# Patient Record
Sex: Female | Born: 1950
Health system: Southern US, Community
[De-identification: ages and names within clinical notes are randomized; demographics above are authoritative.]

## PROBLEM LIST (undated history)

## (undated) DIAGNOSIS — M1990 Unspecified osteoarthritis, unspecified site: Secondary | ICD-10-CM

## (undated) DIAGNOSIS — M6530 Trigger finger, unspecified finger: Secondary | ICD-10-CM

## (undated) HISTORY — DX: Trigger finger, unspecified finger: M65.30

## (undated) HISTORY — PX: TUBAL LIGATION: SHX77

## (undated) HISTORY — DX: Unspecified osteoarthritis, unspecified site: M19.90

---

## 1997-11-19 ENCOUNTER — Emergency Department (HOSPITAL_COMMUNITY): Admission: EM | Admit: 1997-11-19 | Discharge: 1997-11-19 | Payer: Self-pay | Admitting: Emergency Medicine

## 1998-07-22 ENCOUNTER — Ambulatory Visit (HOSPITAL_COMMUNITY): Admission: RE | Admit: 1998-07-22 | Discharge: 1998-07-22 | Payer: Self-pay | Admitting: Gastroenterology

## 1999-02-09 ENCOUNTER — Other Ambulatory Visit: Admission: RE | Admit: 1999-02-09 | Discharge: 1999-02-09 | Payer: Self-pay | Admitting: *Deleted

## 1999-09-03 ENCOUNTER — Other Ambulatory Visit: Admission: RE | Admit: 1999-09-03 | Discharge: 1999-09-03 | Payer: Self-pay | Admitting: *Deleted

## 2003-07-02 ENCOUNTER — Other Ambulatory Visit: Admission: RE | Admit: 2003-07-02 | Discharge: 2003-07-02 | Payer: Self-pay | Admitting: *Deleted

## 2003-07-07 ENCOUNTER — Encounter: Admission: RE | Admit: 2003-07-07 | Discharge: 2003-07-07 | Payer: Self-pay | Admitting: *Deleted

## 2015-08-31 ENCOUNTER — Encounter: Payer: Self-pay | Admitting: *Deleted

## 2016-03-22 ENCOUNTER — Encounter: Payer: Self-pay | Admitting: Physician Assistant

## 2016-03-22 ENCOUNTER — Ambulatory Visit (INDEPENDENT_AMBULATORY_CARE_PROVIDER_SITE_OTHER): Payer: BLUE CROSS/BLUE SHIELD | Admitting: Physician Assistant

## 2016-03-22 VITALS — BP 118/65 | HR 63 | Temp 98.1°F | Ht 65.25 in | Wt 128.8 lb

## 2016-03-22 DIAGNOSIS — Z01419 Encounter for gynecological examination (general) (routine) without abnormal findings: Secondary | ICD-10-CM

## 2016-03-22 NOTE — Patient Instructions (Signed)

## 2016-03-24 NOTE — Progress Notes (Signed)
BP 118/65   Pulse 63   Temp 98.1 F (36.7 C) (Oral)   Ht 5' 5.25" (1.657 m)   Wt 128 lb 12.8 oz (58.4 kg)   BMI 21.27 kg/m    Subjective:    Patient ID: Carol Hoover, female    DOB: Oct 15, 1950, 66 y.o.   MRN: [id number]  Carol Hoover is a 66 y.o. female presenting on 03/22/2016 for Gynecologic Exam (Est care here. Known pt to Particia Nearing)  HPI Patient here to be established as new patient at [facility].  This patient is known to me from [facility].This patient comes in for annual well physical examination. All medications are reviewed today. There are no reports of any problems with the medications. All of the medical conditions are reviewed and updated.  Lab work is reviewed and will be ordered as medically necessary. There are no new problems reported with today's visit.  Patient reports doing well overall.  History reviewed. No pertinent past medical history. Relevant past medical, surgical, family and social history reviewed and updated as indicated. Interim medical history since our last visit reviewed. Allergies and medications reviewed and updated.   Data reviewed from any sources in EPIC.  Review of Systems  Constitutional: Negative.  Negative for activity change, fatigue and fever.  HENT: Negative.   Eyes: Negative.   Respiratory: Negative.  Negative for cough.   Cardiovascular: Negative.  Negative for chest pain.  Gastrointestinal: Negative.  Negative for abdominal pain.  Endocrine: Negative.   Genitourinary: Negative.  Negative for dysuria.  Musculoskeletal: Negative.   Skin: Negative.   Neurological: Negative.      Social History   Social History  . Marital status: Single    Spouse name: N/A  . Number of children: N/A  . Years of education: N/A   Occupational History  . Not on file.   Social History Main Topics  . Smoking status: Former Smoker    Quit date: 03/22/1968  . Smokeless tobacco: Never Used  . Alcohol use Yes       Comment: occasionally  . Drug use: No  . Sexual activity: Not on file   Other Topics Concern  . Not on file   Social History Narrative  . No narrative on file    Past Surgical History:  Procedure Laterality Date  . TUBAL LIGATION      History reviewed. No pertinent family history.  Allergies as of 03/22/2016      Reactions   Codeine Nausea Only      Medication List    as of 03/22/2016 11:59 PM   You have not been prescribed any medications.        Objective:    BP 118/65   Pulse 63   Temp 98.1 F (36.7 C) (Oral)   Ht 5' 5.25" (1.657 m)   Wt 128 lb 12.8 oz (58.4 kg)   BMI 21.27 kg/m   Allergies  Allergen Reactions  . Codeine Nausea Only   Wt Readings from Last 3 Encounters:  03/22/16 128 lb 12.8 oz (58.4 kg)    Physical Exam  Constitutional: She is oriented to person, place, and time. She appears well-developed and well-nourished.  HENT:  Head: Normocephalic and atraumatic.  Eyes: Conjunctivae and EOM are normal. Pupils are equal, round, and reactive to light.  Neck: Normal range of motion. Neck supple.  Cardiovascular: Normal rate, regular rhythm, normal heart sounds and intact distal pulses.   Pulmonary/Chest: Effort normal and breath  sounds normal. Right breast exhibits no mass, no skin change and no tenderness. Left breast exhibits no mass, no skin change and no tenderness. Breasts are symmetrical.  Abdominal: Soft. Bowel sounds are normal.  Genitourinary: Vagina normal and uterus normal. Rectal exam shows no fissure. No breast swelling, tenderness, discharge or bleeding. There is no tenderness or lesion on the right labia. There is no tenderness or lesion on the left labia. Uterus is not deviated, not enlarged and not tender. Cervix exhibits no motion tenderness, no discharge and no friability. Right adnexum displays no mass, no tenderness and no fullness. Left adnexum displays no mass, no tenderness and no fullness. No tenderness or bleeding in the  vagina. No vaginal discharge found.  Neurological: She is alert and oriented to person, place, and time. She has normal reflexes.  Skin: Skin is warm and dry. No rash noted.  Psychiatric: She has a normal mood and affect. Her behavior is normal. Judgment and thought content normal.    No results found for this or any previous visit.    Assessment & Plan:   1. Well female exam with routine gynecological exam - Pap IG, CT/NG w/ reflex HPV when ASC-U ([facility]) - CBC with Differential/Platelet; Future - CMP14+EGFR; Future - Lipid panel; Future   Continue all other maintenance medications as listed above. Educational handout given for health maintenance  Follow up plan: Return in about 1 year (around 03/22/2017).  Terald Sleeper PA-C [facility] [address]  [place], [place] [address] [phone]   03/24/2016, 8:24 PM

## 2016-03-29 ENCOUNTER — Other Ambulatory Visit: Payer: BLUE CROSS/BLUE SHIELD

## 2016-03-29 DIAGNOSIS — Z01419 Encounter for gynecological examination (general) (routine) without abnormal findings: Secondary | ICD-10-CM

## 2016-03-30 LAB — CBC WITH DIFFERENTIAL/PLATELET
BASOS: 1 %
Basophils Absolute: 0 10*3/uL (ref 0.0–0.2)
EOS (ABSOLUTE): 0.1 10*3/uL (ref 0.0–0.4)
EOS: 4 %
HEMATOCRIT: 43.3 % (ref 34.0–46.6)
HEMOGLOBIN: 14.3 g/dL (ref 11.1–15.9)
Immature Grans (Abs): 0 10*3/uL (ref 0.0–0.1)
Immature Granulocytes: 0 %
LYMPHS ABS: 1.5 10*3/uL (ref 0.7–3.1)
Lymphs: 39 %
MCH: 29.8 pg (ref 26.6–33.0)
MCHC: 33 g/dL (ref 31.5–35.7)
MCV: 90 fL (ref 79–97)
MONOCYTES: 7 %
MONOS ABS: 0.3 10*3/uL (ref 0.1–0.9)
Neutrophils Absolute: 1.9 10*3/uL (ref 1.4–7.0)
Neutrophils: 49 %
Platelets: 291 10*3/uL (ref 150–379)
RBC: 4.8 x10E6/uL (ref 3.77–5.28)
RDW: 12.8 % (ref 12.3–15.4)
WBC: 3.9 10*3/uL (ref 3.4–10.8)

## 2016-03-30 LAB — PAP IG, CT-NG, RFX HPV ASCU
Chlamydia, Nuc. Acid Amp: NEGATIVE
Gonococcus by Nucleic Acid Amp: NEGATIVE
PAP Smear Comment: 0

## 2016-03-30 LAB — CMP14+EGFR
ALT: 10 IU/L (ref 0–32)
AST: 19 IU/L (ref 0–40)
Albumin/Globulin Ratio: 1.8 (ref 1.2–2.2)
Albumin: 4.2 g/dL (ref 3.6–4.8)
Alkaline Phosphatase: 80 IU/L (ref 39–117)
BUN/Creatinine Ratio: 19 (ref 12–28)
BUN: 13 mg/dL (ref 8–27)
Bilirubin Total: 0.5 mg/dL (ref 0.0–1.2)
CO2: 28 mmol/L (ref 18–29)
Calcium: 9.5 mg/dL (ref 8.7–10.3)
Chloride: 102 mmol/L (ref 96–106)
Creatinine, Ser: 0.69 mg/dL (ref 0.57–1.00)
GFR calc Af Amer: 106 mL/min/{1.73_m2} (ref >59)
GFR calc non Af Amer: 92 mL/min/{1.73_m2} (ref >59)
Globulin, Total: 2.4 g/dL (ref 1.5–4.5)
Glucose: 87 mg/dL (ref 65–99)
Potassium: 4.5 mmol/L (ref 3.5–5.2)
Sodium: 142 mmol/L (ref 134–144)
Total Protein: 6.6 g/dL (ref 6.0–8.5)

## 2016-03-30 LAB — LIPID PANEL
Chol/HDL Ratio: 2.8 ratio units (ref 0.0–4.4)
Cholesterol, Total: 215 mg/dL — ABNORMAL HIGH (ref 100–199)
HDL: 78 mg/dL (ref >39)
LDL Calculated: 120 mg/dL — ABNORMAL HIGH (ref 0–99)
Triglycerides: 83 mg/dL (ref 0–149)
VLDL Cholesterol Cal: 17 mg/dL (ref 5–40)

## 2016-04-05 ENCOUNTER — Encounter: Payer: Self-pay | Admitting: Physician Assistant

## 2016-04-09 LAB — COLOGUARD

## 2016-06-13 ENCOUNTER — Encounter: Payer: Self-pay | Admitting: Physician Assistant

## 2016-06-13 ENCOUNTER — Ambulatory Visit (INDEPENDENT_AMBULATORY_CARE_PROVIDER_SITE_OTHER): Payer: BLUE CROSS/BLUE SHIELD | Admitting: Physician Assistant

## 2016-06-13 VITALS — BP 114/59 | HR 62 | Temp 97.7°F | Ht 65.25 in | Wt 130.2 lb

## 2016-06-13 DIAGNOSIS — R300 Dysuria: Secondary | ICD-10-CM

## 2016-06-13 DIAGNOSIS — N3000 Acute cystitis without hematuria: Secondary | ICD-10-CM

## 2016-06-13 LAB — URINALYSIS, COMPLETE
Bilirubin, UA: NEGATIVE
GLUCOSE, UA: NEGATIVE
KETONES UA: NEGATIVE
NITRITE UA: NEGATIVE
PROTEIN UA: NEGATIVE
RBC, UA: NEGATIVE
SPEC GRAV UA: 1.01 (ref 1.005–1.030)
UUROB: 0.2 mg/dL (ref 0.2–1.0)
pH, UA: 5 (ref 5.0–7.5)

## 2016-06-13 LAB — MICROSCOPIC EXAMINATION
Epithelial Cells (non renal): NONE SEEN /hpf (ref 0–10)
RBC, UA: NONE SEEN /hpf (ref 0–?)

## 2016-06-13 MED ORDER — NITROFURANTOIN MONOHYD MACRO 100 MG PO CAPS: 100.0000 mg | ORAL_CAPSULE | Freq: Two times a day (BID) | ORAL | 0 refills | Status: DC

## 2016-06-13 NOTE — Progress Notes (Signed)
BP (!) 114/59   Pulse 62   Temp 97.7 F (36.5 C) (Oral)   Ht 5' 5.25" (1.657 m)   Wt 130 lb 3.2 oz (59.1 kg)   BMI 21.50 kg/m    Subjective:    Patient ID: Carol Hoover Signs, female    DOB: Jun 03, 1950, 66 y.o.   MRN: [id number]  HPI: BANEZA BARTOSZEK is a 66 y.o. female presenting on 06/13/2016 for lower abdominal pain (pelvic pain )  The past 2 days patient has had increasing pain over the suprapubic region. It hurts inside. She is having some urinary frequency. She is not having any dysuria. She states she has occasional bladder infections but not very often. She denies any fever or chills. She's not had any specific injury to her hips or pelvis. She does a lot of hiking.  Relevant past medical, surgical, family and social history reviewed and updated as indicated. Allergies and medications reviewed and updated.  History reviewed. No pertinent past medical history.  Past Surgical History:  Procedure Laterality Date  . TUBAL LIGATION      Review of Systems  Constitutional: Negative.   HENT: Negative.   Eyes: Negative.   Respiratory: Negative.   Gastrointestinal: Negative.   Genitourinary: Positive for difficulty urinating, dysuria, frequency and urgency. Negative for flank pain.    Allergies as of 06/13/2016      Reactions   Codeine Nausea Only      Medication List       Accurate as of 06/13/16  3:41 PM. Always use your most recent med list.          nitrofurantoin (macrocrystal-monohydrate) 100 MG capsule Commonly known as:  MACROBID Take 1 capsule (100 mg total) by mouth 2 (two) times daily. 1 po BId          Objective:    BP (!) 114/59   Pulse 62   Temp 97.7 F (36.5 C) (Oral)   Ht 5' 5.25" (1.657 m)   Wt 130 lb 3.2 oz (59.1 kg)   BMI 21.50 kg/m   Allergies  Allergen Reactions  . Codeine Nausea Only    Physical Exam  Constitutional: She is oriented to person, place, and time. She appears well-developed and well-nourished.  HENT:  Head:  Normocephalic and atraumatic.  Eyes: Conjunctivae are normal. Pupils are equal, round, and reactive to light.  Cardiovascular: Normal rate, regular rhythm, normal heart sounds and intact distal pulses.   Pulmonary/Chest: Effort normal and breath sounds normal.  Abdominal: Soft. Bowel sounds are normal. She exhibits no distension and no mass. There is tenderness in the suprapubic area. There is no rebound, no guarding and no CVA tenderness.  Neurological: She is alert and oriented to person, place, and time. She has normal reflexes.  Skin: Skin is warm and dry. No rash noted.  Psychiatric: She has a normal mood and affect. Her behavior is normal. Judgment and thought content normal.  Nursing note and vitals reviewed.       Assessment & Plan:   1. Dysuria - Urine culture - Urinalysis, Complete - nitrofurantoin, macrocrystal-monohydrate, (MACROBID) 100 MG capsule; Take 1 capsule (100 mg total) by mouth 2 (two) times daily. 1 po BId  Dispense: 14 capsule; Refill: 0  2. Acute cystitis without hematuria - nitrofurantoin, macrocrystal-monohydrate, (MACROBID) 100 MG capsule; Take 1 capsule (100 mg total) by mouth 2 (two) times daily. 1 po BId  Dispense: 14 capsule; Refill: 0   Continue all other maintenance medications as listed above.  Follow up plan: Return if symptoms worsen or fail to improve.  Educational handout given for UTI  Terald Sleeper PA-C [facility] [address]  [place],  [address] [phone]   06/13/2016, 3:41 PM

## 2016-06-13 NOTE — Patient Instructions (Signed)

## 2016-06-15 LAB — URINE CULTURE

## 2016-10-04 ENCOUNTER — Ambulatory Visit (INDEPENDENT_AMBULATORY_CARE_PROVIDER_SITE_OTHER): Payer: BLUE CROSS/BLUE SHIELD | Admitting: Physician Assistant

## 2016-10-04 ENCOUNTER — Encounter: Payer: Self-pay | Admitting: Physician Assistant

## 2016-10-04 VITALS — BP 107/47 | HR 60 | Temp 98.2°F | Ht 65.25 in | Wt 124.0 lb

## 2016-10-04 DIAGNOSIS — J0100 Acute maxillary sinusitis, unspecified: Secondary | ICD-10-CM | POA: Diagnosis not present

## 2016-10-04 MED ORDER — AMOXICILLIN-POT CLAVULANATE 875-125 MG PO TABS: 1.0000 | ORAL_TABLET | Freq: Two times a day (BID) | ORAL | 0 refills | Status: DC

## 2016-10-04 NOTE — Patient Instructions (Signed)
In a few days you may receive a survey in the mail or online from [employer] regarding your visit with us today. Please take a moment to fill this out. Your feedback is very important to our whole office. It can help us better understand your needs as well as improve your experience and satisfaction. Thank you for taking your time to complete it. We care about you.  Jacy Brocker, PA-C  

## 2016-10-04 NOTE — Progress Notes (Signed)
BP (!) 107/47   Pulse 60   Temp 98.2 F (36.8 C) (Oral)   Ht 5' 5.25" (1.657 m)   Wt 124 lb (56.2 kg)   BMI 20.48 kg/m    Subjective:    Patient ID: Carol Hoover, female    DOB: 08-Dec-1950, 66 y.o.   MRN: [id number]  HPI: Carol Hoover is a 66 y.o. female presenting on 10/04/2016 for Sinus Problem  This patient has had many days of sinus headache and postnasal drainage. There is copious drainage at times. Denies any fever at this time. There has been a history of sinus infections in the past.  No history of sinus surgery. There is cough at night. It has become more prevalent in recent days. She has dealt with this for over 3 weeks.  Relevant past medical, surgical, family and social history reviewed and updated as indicated. Allergies and medications reviewed and updated.  No past medical history on file.  Past Surgical History:  Procedure Laterality Date  . TUBAL LIGATION      Review of Systems  Constitutional: Negative.  Negative for activity change, appetite change, fatigue and fever.  HENT: Positive for congestion, postnasal drip, sinus pain, sinus pressure and sore throat.   Eyes: Negative.   Respiratory: Negative.  Negative for cough.   Cardiovascular: Negative.  Negative for chest pain, palpitations and leg swelling.  Gastrointestinal: Negative.  Negative for abdominal pain.  Endocrine: Negative.   Genitourinary: Negative.  Negative for dysuria.  Musculoskeletal: Negative.   Skin: Negative.   Neurological: Negative.     Allergies as of 10/04/2016      Reactions   Codeine Nausea Only      Medication List       Accurate as of 10/04/16  6:02 PM. Always use your most recent med list.          amoxicillin-clavulanate 875-125 MG tablet Commonly known as:  AUGMENTIN Take 1 tablet by mouth 2 (two) times daily.   RESTASIS MULTIDOSE 0.05 % ophthalmic emulsion Generic drug:  cycloSPORINE          Objective:    BP (!) 107/47   Pulse 60   Temp 98.2 F  (36.8 C) (Oral)   Ht 5' 5.25" (1.657 m)   Wt 124 lb (56.2 kg)   BMI 20.48 kg/m   Allergies  Allergen Reactions  . Codeine Nausea Only    Physical Exam  Constitutional: She is oriented to person, place, and time. She appears well-developed and well-nourished.  HENT:  Head: Normocephalic and atraumatic.  Right Ear: Tympanic membrane and external ear normal. No middle ear effusion.  Left Ear: Tympanic membrane and external ear normal.  No middle ear effusion.  Nose: Mucosal edema and rhinorrhea present. Right sinus exhibits no maxillary sinus tenderness. Left sinus exhibits no maxillary sinus tenderness.  Mouth/Throat: Uvula is midline. Posterior oropharyngeal erythema present.  Eyes: Pupils are equal, round, and reactive to light. Conjunctivae and EOM are normal. Right eye exhibits no discharge. Left eye exhibits no discharge.  Neck: Normal range of motion.  Cardiovascular: Normal rate, regular rhythm and normal heart sounds.   Pulmonary/Chest: Effort normal and breath sounds normal. No respiratory distress. She has no wheezes.  Abdominal: Soft.  Lymphadenopathy:    She has no cervical adenopathy.  Neurological: She is alert and oriented to person, place, and time.  Skin: Skin is warm and dry.  Psychiatric: She has a normal mood and affect.  Assessment & Plan:   1. Acute non-recurrent maxillary sinusitis - amoxicillin-clavulanate (AUGMENTIN) 875-125 MG tablet; Take 1 tablet by mouth 2 (two) times daily.  Dispense: 20 tablet; Refill: 0   Continue all other maintenance medications as listed above.  Follow up plan: Return if symptoms worsen or fail to improve.  Educational handout given for [place] PA-C [facility] [address]  [place],  [address] [phone]   10/04/2016, 6:02 PM

## 2016-10-14 ENCOUNTER — Encounter: Payer: Self-pay | Admitting: Nurse Practitioner

## 2016-10-14 ENCOUNTER — Ambulatory Visit (INDEPENDENT_AMBULATORY_CARE_PROVIDER_SITE_OTHER): Payer: BLUE CROSS/BLUE SHIELD | Admitting: Nurse Practitioner

## 2016-10-14 ENCOUNTER — Telehealth: Payer: Self-pay | Admitting: Physician Assistant

## 2016-10-14 VITALS — BP 121/63 | HR 55 | Temp 97.5°F | Ht 65.0 in | Wt 125.0 lb

## 2016-10-14 DIAGNOSIS — R350 Frequency of micturition: Secondary | ICD-10-CM

## 2016-10-14 LAB — URINALYSIS, COMPLETE
Bilirubin, UA: NEGATIVE
GLUCOSE, UA: NEGATIVE
KETONES UA: NEGATIVE
Leukocytes, UA: NEGATIVE
NITRITE UA: NEGATIVE
Protein, UA: NEGATIVE
RBC, UA: NEGATIVE
Specific Gravity, UA: <1.005 — ABNORMAL LOW (ref 1.005–1.030)
UUROB: 0.2 mg/dL (ref 0.2–1.0)
pH, UA: 5 (ref 5.0–7.5)

## 2016-10-14 LAB — MICROSCOPIC EXAMINATION
Bacteria, UA: NONE SEEN
Epithelial Cells (non renal): NONE SEEN /hpf (ref 0–10)
RBC MICROSCOPIC, UA: NONE SEEN /HPF (ref 0–?)
Renal Epithel, UA: NONE SEEN /hpf

## 2016-10-14 NOTE — Patient Instructions (Signed)

## 2016-10-14 NOTE — Progress Notes (Signed)
   Subjective:    Patient ID: Carol Hoover, female    DOB: [birth year], 66 y.o.   MRN: [id number]  HPI Patient was seen on 10/04/16 by A. Ronnald Ramp and was dx with URI. SHe was given Augmentin and has one day left to take med. Today she is c/o pressure in lower abdomen. SHe denies dysuria but patient drinks a lot of water. Denies back pain.    Review of Systems  Constitutional: Negative for appetite change, chills and fever.  Respiratory: Negative.   Cardiovascular: Negative.   Gastrointestinal: Positive for abdominal pain (suprapubic pain).  Genitourinary: Positive for frequency, pelvic pain and urgency. Negative for dysuria.  Neurological: Negative.   Psychiatric/Behavioral: Negative.   All other systems reviewed and are negative.      Objective:   Physical Exam  Constitutional: She is oriented to person, place, and time. She appears well-developed and well-nourished. No distress.  Cardiovascular: Normal rate and regular rhythm.   Pulmonary/Chest: Effort normal and breath sounds normal.  Neurological: She is alert and oriented to person, place, and time.  Skin: Skin is warm.  Psychiatric: She has a normal mood and affect. Her behavior is normal. Judgment and thought content normal.   BP 121/63   Pulse (!) 55   Temp (!) 97.5 F (36.4 C) (Oral)   Ht 5\' 5"  (1.651 m)   Wt 125 lb (56.7 kg)   BMI 20.80 kg/m   Urine clear      Assessment & Plan:  Urinary frequency - Plan: Urinalysis, Complete  Force fluids Cranberry juice RTO prn  Mary-Margaret Hassell Done, FNP

## 2017-03-08 DIAGNOSIS — G5761 Lesion of plantar nerve, right lower limb: Secondary | ICD-10-CM | POA: Diagnosis not present

## 2017-03-09 DIAGNOSIS — H2510 Age-related nuclear cataract, unspecified eye: Secondary | ICD-10-CM | POA: Diagnosis not present

## 2017-03-09 DIAGNOSIS — Z0100 Encounter for examination of eyes and vision without abnormal findings: Secondary | ICD-10-CM | POA: Diagnosis not present

## 2017-03-13 ENCOUNTER — Encounter: Payer: Self-pay | Admitting: Nurse Practitioner

## 2017-03-13 DIAGNOSIS — Z1231 Encounter for screening mammogram for malignant neoplasm of breast: Secondary | ICD-10-CM | POA: Diagnosis not present

## 2017-03-14 DIAGNOSIS — R69 Illness, unspecified: Secondary | ICD-10-CM | POA: Diagnosis not present

## 2017-06-27 DIAGNOSIS — G5761 Lesion of plantar nerve, right lower limb: Secondary | ICD-10-CM | POA: Diagnosis not present

## 2017-06-27 DIAGNOSIS — M779 Enthesopathy, unspecified: Secondary | ICD-10-CM | POA: Diagnosis not present

## 2017-09-19 DIAGNOSIS — R69 Illness, unspecified: Secondary | ICD-10-CM | POA: Diagnosis not present

## 2017-09-26 DIAGNOSIS — G5761 Lesion of plantar nerve, right lower limb: Secondary | ICD-10-CM | POA: Diagnosis not present

## 2017-10-02 ENCOUNTER — Ambulatory Visit (INDEPENDENT_AMBULATORY_CARE_PROVIDER_SITE_OTHER): Payer: Medicare HMO | Admitting: Nurse Practitioner

## 2017-10-02 ENCOUNTER — Encounter: Payer: Self-pay | Admitting: Nurse Practitioner

## 2017-10-02 VITALS — BP 125/63 | HR 81 | Temp 98.1°F | Ht 65.0 in | Wt 129.0 lb

## 2017-10-02 DIAGNOSIS — J0100 Acute maxillary sinusitis, unspecified: Secondary | ICD-10-CM

## 2017-10-02 MED ORDER — AMOXICILLIN-POT CLAVULANATE 875-125 MG PO TABS: 1.0000 | ORAL_TABLET | Freq: Two times a day (BID) | ORAL | 0 refills | Status: DC

## 2017-10-02 NOTE — Patient Instructions (Signed)

## 2017-10-02 NOTE — Progress Notes (Signed)
   Subjective:    Patient ID: Carol Hoover, female    DOB: 1950-05-22, 67 y.o.   MRN: [id number]   Chief Complaint: Sinus Problem (teeth pain)   HPI Patient come sin today c/o sinus congetsion with facial pain. Started about 1 month ago and has gotten worse the last 3 days. Has headache. Has used OTC sinus meds and allergy meds with no relief.    Review of Systems  Constitutional: Positive for chills. Negative for fever.  HENT: Positive for congestion, postnasal drip, rhinorrhea, sinus pressure and sinus pain. Negative for sore throat, trouble swallowing and voice change.   Respiratory: Negative for cough and shortness of breath.   Gastrointestinal: Negative.   Genitourinary: Negative.   Neurological: Positive for headaches.  Psychiatric/Behavioral: Negative.   All other systems reviewed and are negative.      Objective:   Physical Exam  Constitutional: She is oriented to person, place, and time. She appears well-developed and well-nourished. She appears distressed (mild).  HENT:  Head: Normocephalic and atraumatic.  Right Ear: Hearing, tympanic membrane, external ear and ear canal normal.  Left Ear: Hearing, tympanic membrane, external ear and ear canal normal.  Nose: Mucosal edema and rhinorrhea present. Right sinus exhibits maxillary sinus tenderness. Left sinus exhibits maxillary sinus tenderness.  Mouth/Throat: Uvula is midline, oropharynx is clear and moist and mucous membranes are normal.  Neck: Normal range of motion. Neck supple.  Cardiovascular: Normal rate.  Pulmonary/Chest: Effort normal.  Abdominal: Soft.  Neurological: She is alert and oriented to person, place, and time.  Skin: Skin is warm and dry.  Psychiatric: She has a normal mood and affect.   BP 125/63   Pulse 81   Temp 98.1 F (36.7 C) (Oral)   Ht 5\' 5"  (1.651 m)   Wt 129 lb (58.5 kg)   BMI 21.47 kg/m      Assessment & Plan:  [name] in today with chief complaint of Sinus Problem  (teeth pain)   1. Acute maxillary sinusitis, recurrence not specified 1. Take meds as prescribed 2. Use a cool mist humidifier especially during the winter months and when heat has been humid. 3. Use saline nose sprays frequently 4. Saline irrigations of the nose can be very helpful if done frequently.  * 4X daily for 1 week*  * Use of a nettie pot can be helpful with this. Follow directions with this* 5. Drink plenty of fluids 6. Keep thermostat turn down low 7.For any cough or congestion  Use plain Mucinex- regular strength or max strength is fine   * Children- consult with Pharmacist for dosing 8. For fever or aces or pains- take tylenol or ibuprofen appropriate for age and weight.  * for fevers greater than 101 orally you may alternate ibuprofen and tylenol every  3 hours.   Meds ordered this encounter  Medications  . amoxicillin-clavulanate (AUGMENTIN) 875-125 MG tablet    Sig: Take 1 tablet by mouth 2 (two) times daily.    Dispense:  20 tablet    Refill:  0    Order Specific Question:   Supervising Provider    Answer:   Eustaquio Maize [[id number]]   Mary-Margaret Hassell Done, FNP

## 2017-11-08 DIAGNOSIS — M79671 Pain in right foot: Secondary | ICD-10-CM | POA: Diagnosis not present

## 2017-11-08 DIAGNOSIS — M7741 Metatarsalgia, right foot: Secondary | ICD-10-CM | POA: Diagnosis not present

## 2017-11-13 ENCOUNTER — Ambulatory Visit (INDEPENDENT_AMBULATORY_CARE_PROVIDER_SITE_OTHER): Payer: Medicare HMO | Admitting: Physician Assistant

## 2017-11-13 ENCOUNTER — Encounter: Payer: Self-pay | Admitting: Physician Assistant

## 2017-11-13 VITALS — BP 124/72 | HR 70 | Temp 97.7°F | Ht 65.0 in | Wt 128.4 lb

## 2017-11-13 DIAGNOSIS — N952 Postmenopausal atrophic vaginitis: Secondary | ICD-10-CM

## 2017-11-13 DIAGNOSIS — Z0000 Encounter for general adult medical examination without abnormal findings: Secondary | ICD-10-CM | POA: Diagnosis not present

## 2017-11-13 MED ORDER — PROGESTERONE MICRONIZED 100 MG PO CAPS: 100.0000 mg | ORAL_CAPSULE | ORAL | 5 refills | Status: DC

## 2017-11-13 MED ORDER — ESTRADIOL 10 MCG VA TABS: 1.0000 | ORAL_TABLET | Freq: Every day | VAGINAL | 5 refills | Status: DC

## 2017-11-13 NOTE — Progress Notes (Signed)
BP 124/72   Pulse 70   Temp 97.7 F (36.5 C) (Oral)   Ht '5\' 5"'$  (1.651 m)   Wt 128 lb 6.4 oz (58.2 kg)   BMI 21.37 kg/m    Subjective:    Patient ID: Carol Hoover, female    DOB: [birth year], 67 y.o.   MRN: [id number]  HPI: Carol Hoover is a 67 y.o. female presenting on 11/13/2017 for Annual Exam  This patient comes in for annual well physical examination. All medications are reviewed today. There are no reports of any problems with the medications. All of the medical conditions are reviewed and updated.  Lab work is reviewed and will be ordered as medically necessary. There are no new problems reported with today's visit.  Patient reports doing well overall.  Has vaginal atrophy, tightness and pain. She complains of significant dryness.  History reviewed. No pertinent past medical history. Relevant past medical, surgical, family and social history reviewed and updated as indicated. Interim medical history since our last visit reviewed. Allergies and medications reviewed and updated. DATA REVIEWED: CHART IN EPIC  Family History reviewed for pertinent findings.  Review of Systems  Constitutional: Negative.  Negative for activity change, fatigue and fever.  HENT: Negative.   Eyes: Negative.   Respiratory: Negative.  Negative for cough.   Cardiovascular: Negative.  Negative for chest pain.  Gastrointestinal: Negative.  Negative for abdominal pain.  Endocrine: Negative.   Genitourinary: Positive for vaginal bleeding and vaginal pain. Negative for dysuria, pelvic pain and urgency.  Musculoskeletal: Negative.   Skin: Negative.   Neurological: Negative.     Allergies as of 11/13/2017      Reactions   Codeine Nausea Only      Medication List        Accurate as of 11/13/17  9:44 PM. Always use your most recent med list.          Estradiol 10 MCG Tabs vaginal tablet Place 1 tablet (10 mcg total) vaginally daily. After 2 weeks, use 1 twice weekly   progesterone 100 MG  capsule Commonly known as:  PROMETRIUM Take 1 capsule (100 mg total) by mouth once a week.   RESTASIS MULTIDOSE 0.05 % ophthalmic emulsion Generic drug:  cycloSPORINE          Objective:    BP 124/72   Pulse 70   Temp 97.7 F (36.5 C) (Oral)   Ht '5\' 5"'$  (1.651 m)   Wt 128 lb 6.4 oz (58.2 kg)   BMI 21.37 kg/m   Allergies  Allergen Reactions  . Codeine Nausea Only    Wt Readings from Last 3 Encounters:  11/13/17 128 lb 6.4 oz (58.2 kg)  10/02/17 129 lb (58.5 kg)  10/14/16 125 lb (56.7 kg)    Physical Exam  Constitutional: She is oriented to person, place, and time. She appears well-developed and well-nourished.  HENT:  Head: Normocephalic and atraumatic.  Eyes: Pupils are equal, round, and reactive to light. Conjunctivae and EOM are normal.  Neck: Normal range of motion. Neck supple.  Cardiovascular: Normal rate, regular rhythm, normal heart sounds and intact distal pulses.  Pulmonary/Chest: Effort normal and breath sounds normal. Right breast exhibits no mass, no skin change and no tenderness. Left breast exhibits no mass, no skin change and no tenderness. No breast tenderness, discharge or bleeding. Breasts are symmetrical.  Abdominal: Soft. Bowel sounds are normal.  Genitourinary: Vagina normal and uterus normal. Rectal exam shows no fissure. No breast tenderness, discharge or  bleeding. There is no tenderness or lesion on the right labia. There is no tenderness or lesion on the left labia. Uterus is not deviated, not enlarged and not tender. Cervix exhibits no motion tenderness, no discharge and no friability. Right adnexum displays no mass, no tenderness and no fullness. Left adnexum displays no mass, no tenderness and no fullness. No tenderness or bleeding in the vagina. No vaginal discharge found.  Genitourinary Comments: Atrophy and thinned wall of vagina  Neurological: She is alert and oriented to person, place, and time. She has normal reflexes.  Skin: Skin is warm  and dry. No rash noted.  Psychiatric: She has a normal mood and affect. Her behavior is normal. Judgment and thought content normal.    Results for orders placed or performed in visit on 10/14/16  Microscopic Examination  Result Value Ref Range   WBC, UA 0-5 0 - 5 /hpf   RBC, UA None seen 0 - 2 /hpf   Epithelial Cells (non renal) None seen 0 - 10 /hpf   Renal Epithel, UA None seen None seen /hpf   Bacteria, UA None seen None seen/Few  Urinalysis, Complete  Result Value Ref Range   Specific Gravity, UA <1.005 (L) 1.005 - 1.030   pH, UA 5.0 5.0 - 7.5   Color, UA Yellow Yellow   Appearance Ur Clear Clear   Leukocytes, UA Negative Negative   Protein, UA Negative Negative/Trace   Glucose, UA Negative Negative   Ketones, UA Negative Negative   RBC, UA Negative Negative   Bilirubin, UA Negative Negative   Urobilinogen, Ur 0.2 0.2 - 1.0 mg/dL   Nitrite, UA Negative Negative   Microscopic Examination See below:       Assessment & Plan:   1. Well adult exam - CBC with Differential/Platelet; Future - CMP14+EGFR; Future - Lipid panel; Future - TSH; Future  2. Atrophic vaginitis - Estradiol 10 MCG TABS vaginal tablet; Place 1 tablet (10 mcg total) vaginally daily. After 2 weeks, use 1 twice weekly  Dispense: 18 tablet; Refill: 5 - progesterone (PROMETRIUM) 100 MG capsule; Take 1 capsule (100 mg total) by mouth once a week.  Dispense: 12 capsule; Refill: 5   Continue all other maintenance medications as listed above.  Follow up plan: Return in about 3 months (around 02/12/2018) for recheck.  Educational handout given for [place] PA-C [facility] [address]  [place], [place] [address] [phone]   11/13/2017, 9:44 PM

## 2017-11-23 DIAGNOSIS — M7741 Metatarsalgia, right foot: Secondary | ICD-10-CM | POA: Diagnosis not present

## 2017-12-01 DIAGNOSIS — M7741 Metatarsalgia, right foot: Secondary | ICD-10-CM | POA: Diagnosis not present

## 2017-12-18 ENCOUNTER — Telehealth: Payer: Self-pay | Admitting: Physician Assistant

## 2017-12-18 NOTE — Telephone Encounter (Signed)
Patient aware.

## 2017-12-18 NOTE — Telephone Encounter (Signed)
Patient states that when she has intercourse it is still very painful and she wants to know if she should increase the estradiol vaginal tablets she is using twice weekly. Should she increase lubrication? SHe said after intercourse yesterday that she had a little blood.

## 2017-12-18 NOTE — Telephone Encounter (Signed)
Ok to increase her tablets and definite;y the lubrication

## 2018-01-03 DIAGNOSIS — M1712 Unilateral primary osteoarthritis, left knee: Secondary | ICD-10-CM | POA: Diagnosis not present

## 2018-01-09 DIAGNOSIS — M1712 Unilateral primary osteoarthritis, left knee: Secondary | ICD-10-CM | POA: Diagnosis not present

## 2018-01-09 DIAGNOSIS — M25562 Pain in left knee: Secondary | ICD-10-CM | POA: Diagnosis not present

## 2018-01-12 DIAGNOSIS — M25562 Pain in left knee: Secondary | ICD-10-CM | POA: Diagnosis not present

## 2018-01-12 DIAGNOSIS — M1712 Unilateral primary osteoarthritis, left knee: Secondary | ICD-10-CM | POA: Diagnosis not present

## 2018-01-16 DIAGNOSIS — M25562 Pain in left knee: Secondary | ICD-10-CM | POA: Diagnosis not present

## 2018-01-16 DIAGNOSIS — M1712 Unilateral primary osteoarthritis, left knee: Secondary | ICD-10-CM | POA: Diagnosis not present

## 2018-01-23 DIAGNOSIS — M1712 Unilateral primary osteoarthritis, left knee: Secondary | ICD-10-CM | POA: Diagnosis not present

## 2018-01-30 DIAGNOSIS — M1712 Unilateral primary osteoarthritis, left knee: Secondary | ICD-10-CM | POA: Diagnosis not present

## 2018-02-06 DIAGNOSIS — M1711 Unilateral primary osteoarthritis, right knee: Secondary | ICD-10-CM | POA: Diagnosis not present

## 2018-02-09 ENCOUNTER — Encounter: Payer: Self-pay | Admitting: Physician Assistant

## 2018-02-09 ENCOUNTER — Ambulatory Visit (INDEPENDENT_AMBULATORY_CARE_PROVIDER_SITE_OTHER): Payer: Medicare HMO | Admitting: Physician Assistant

## 2018-02-09 ENCOUNTER — Other Ambulatory Visit: Payer: Self-pay | Admitting: Physician Assistant

## 2018-02-09 ENCOUNTER — Telehealth: Payer: Self-pay | Admitting: Physician Assistant

## 2018-02-09 VITALS — BP 129/71 | HR 83 | Temp 98.8°F | Ht 65.0 in | Wt 132.2 lb

## 2018-02-09 DIAGNOSIS — R69 Illness, unspecified: Secondary | ICD-10-CM | POA: Diagnosis not present

## 2018-02-09 DIAGNOSIS — J0110 Acute frontal sinusitis, unspecified: Secondary | ICD-10-CM | POA: Diagnosis not present

## 2018-02-09 DIAGNOSIS — A6000 Herpesviral infection of urogenital system, unspecified: Secondary | ICD-10-CM

## 2018-02-09 MED ORDER — AMOXICILLIN 500 MG PO CAPS: 500.0000 mg | ORAL_CAPSULE | Freq: Three times a day (TID) | ORAL | 0 refills | Status: DC

## 2018-02-09 MED ORDER — VALACYCLOVIR HCL 1 G PO TABS: 1000.0000 mg | ORAL_TABLET | Freq: Two times a day (BID) | ORAL | 5 refills | Status: DC

## 2018-02-09 NOTE — Telephone Encounter (Signed)
Appointment given for 11:10 today.

## 2018-02-11 NOTE — Progress Notes (Signed)
BP 129/71   Pulse 83   Temp 98.8 F (37.1 C) (Oral)   Ht 5\' 5"  (1.651 m)   Wt 132 lb 3.2 oz (60 kg)   BMI 22.00 kg/m    Subjective:    Patient ID: Carol Hoover, female    DOB: [birth year], 67 y.o.   MRN: [id number]  HPI: Carol Hoover is a 67 y.o. female presenting on 02/09/2018 for Vaginal blister  She has a new onset of blisters in the labia, very painful. She has been under a lot of stress and has a new sexual partner. He has never had genital blister but does get oral fever blisters.  This patient has had many days of sore throat and postnasal drainage, headache at times and sinus pressure. There is copious drainage at times. Denies any fever at this time. There has been a history of sinus infections in the past.  There is cough at night. It has become more prevalent in recent days.   History reviewed. No pertinent past medical history. Relevant past medical, surgical, family and social history reviewed and updated as indicated. Interim medical history since our last visit reviewed. Allergies and medications reviewed and updated. DATA REVIEWED: CHART IN EPIC  Family History reviewed for pertinent findings.  Review of Systems  Constitutional: Positive for chills and fatigue. Negative for activity change and appetite change.  HENT: Positive for congestion, postnasal drip and sore throat.   Eyes: Negative.   Respiratory: Positive for cough and wheezing.   Cardiovascular: Negative.  Negative for chest pain, palpitations and leg swelling.  Gastrointestinal: Negative.   Genitourinary: Positive for vaginal pain. Negative for vaginal discharge.  Musculoskeletal: Negative.   Skin: Negative.   Neurological: Positive for headaches.    Allergies as of 02/09/2018      Reactions   Codeine Nausea Only      Medication List        Accurate as of 02/09/18 11:59 PM. Always use your most recent med list.          amoxicillin 500 MG capsule Commonly known as:  AMOXIL Take 1  capsule (500 mg total) by mouth 3 (three) times daily.   Estradiol 10 MCG Tabs vaginal tablet Place 1 tablet (10 mcg total) vaginally daily. After 2 weeks, use 1 twice weekly   progesterone 100 MG capsule Commonly known as:  PROMETRIUM Take 1 capsule (100 mg total) by mouth once a week.   RESTASIS MULTIDOSE 0.05 % ophthalmic emulsion Generic drug:  cycloSPORINE   valACYclovir 1000 MG tablet Commonly known as:  VALTREX TAKE 1 TABLET BY MOUTH TWICE DAILY FOR  PROVENTION          Objective:    BP 129/71   Pulse 83   Temp 98.8 F (37.1 C) (Oral)   Ht 5\' 5"  (1.651 m)   Wt 132 lb 3.2 oz (60 kg)   BMI 22.00 kg/m   Allergies  Allergen Reactions  . Codeine Nausea Only    Wt Readings from Last 3 Encounters:  02/09/18 132 lb 3.2 oz (60 kg)  11/13/17 128 lb 6.4 oz (58.2 kg)  10/02/17 129 lb (58.5 kg)    Physical Exam  Constitutional: She is oriented to person, place, and time. She appears well-developed and well-nourished.  HENT:  Head: Normocephalic and atraumatic.  Right Ear: Tympanic membrane and external ear normal. No middle ear effusion.  Left Ear: Tympanic membrane and external ear normal.  No middle ear effusion.  Nose: Mucosal  edema and rhinorrhea present. Right sinus exhibits no maxillary sinus tenderness. Left sinus exhibits no maxillary sinus tenderness.  Mouth/Throat: Uvula is midline. Posterior oropharyngeal erythema present.  Eyes: Pupils are equal, round, and reactive to light. Conjunctivae and EOM are normal. Right eye exhibits no discharge. Left eye exhibits no discharge.  Neck: Normal range of motion.  Cardiovascular: Normal rate, regular rhythm and normal heart sounds.  Pulmonary/Chest: Effort normal and breath sounds normal. No respiratory distress. She has no wheezes.  Abdominal: Soft.  Genitourinary:    There is lesion on the right labia.  Lymphadenopathy:    She has no cervical adenopathy.  Neurological: She is alert and oriented to person,  place, and time.  Skin: Skin is warm and dry.  Psychiatric: She has a normal mood and affect.    Results for orders placed or performed in visit on 02/09/18  Herpes simplex virus culture  Result Value Ref Range   HSV Culture/Type Comment       Assessment & Plan:   1. Genital herpes simplex, unspecified site - Herpes simplex virus culture  2. Acute non-recurrent frontal sinusitis - amoxicillin (AMOXIL) 500 MG capsule; Take 1 capsule (500 mg total) by mouth 3 (three) times daily.  Dispense: 30 capsule; Refill: 0   Continue all other maintenance medications as listed above.  Follow up plan: No follow-ups on file.  Educational handout given for [place] PA-C [facility] [address]  [place], [place] [address] [phone]   02/11/2018, 11:00 PM

## 2018-02-12 LAB — HERPES SIMPLEX VIRUS CULTURE

## 2018-03-13 DIAGNOSIS — M1712 Unilateral primary osteoarthritis, left knee: Secondary | ICD-10-CM | POA: Diagnosis not present

## 2018-03-21 DIAGNOSIS — H43813 Vitreous degeneration, bilateral: Secondary | ICD-10-CM | POA: Diagnosis not present

## 2018-03-21 DIAGNOSIS — H2510 Age-related nuclear cataract, unspecified eye: Secondary | ICD-10-CM | POA: Diagnosis not present

## 2018-03-21 DIAGNOSIS — Z0100 Encounter for examination of eyes and vision without abnormal findings: Secondary | ICD-10-CM | POA: Diagnosis not present

## 2018-03-21 DIAGNOSIS — H43812 Vitreous degeneration, left eye: Secondary | ICD-10-CM | POA: Diagnosis not present

## 2018-03-23 ENCOUNTER — Encounter: Payer: Self-pay | Admitting: Nurse Practitioner

## 2018-03-23 ENCOUNTER — Ambulatory Visit (INDEPENDENT_AMBULATORY_CARE_PROVIDER_SITE_OTHER): Payer: Medicare HMO | Admitting: Nurse Practitioner

## 2018-03-23 VITALS — BP 131/73 | HR 65 | Temp 97.1°F | Ht 65.0 in | Wt 133.0 lb

## 2018-03-23 DIAGNOSIS — L247 Irritant contact dermatitis due to plants, except food: Secondary | ICD-10-CM | POA: Diagnosis not present

## 2018-03-23 MED ORDER — PREDNISONE 10 MG (21) PO TBPK: ORAL_TABLET | ORAL | 0 refills | Status: DC

## 2018-03-23 MED ORDER — SULFAMETHOXAZOLE-TRIMETHOPRIM 800-160 MG PO TABS: 1.0000 | ORAL_TABLET | Freq: Two times a day (BID) | ORAL | 0 refills | Status: DC

## 2018-03-23 NOTE — Progress Notes (Signed)
   Subjective:    Patient ID: Carol Hoover, female    DOB: [birth year], 68 y.o.   MRN: [id number]   Chief Complaint: Rash   HPI Patient was cleaning out her ivy bed last Saturday and she gt into some poison oak. Right arm is now swollen and red.     Review of Systems  Constitutional: Negative.   Respiratory: Negative.   Cardiovascular: Negative.   Skin: Positive for rash.  Neurological: Negative.   Psychiatric/Behavioral: Negative.   All other systems reviewed and are negative.      Objective:   Physical Exam Constitutional:      General: She is not in acute distress.    Appearance: She is normal weight.  Cardiovascular:     Rate and Rhythm: Normal rate and regular rhythm.     Heart sounds: Normal heart sounds.  Pulmonary:     Effort: Pulmonary effort is normal.     Breath sounds: Normal breath sounds.  Skin:    General: Skin is warm and dry.     Comments: Erythematous vesicular rash on right forearm.  Neurological:     General: No focal deficit present.     Mental Status: She is alert and oriented to person, place, and time.  Psychiatric:        Mood and Affect: Mood normal.        Behavior: Behavior normal.       BP 131/73   Pulse 65   Temp (!) 97.1 F (36.2 C) (Oral)   Ht 5\' 5"  (1.651 m)   Wt 133 lb (60.3 kg)   BMI 22.13 kg/m      Assessment & Plan:  Carol Hoover in today with chief complaint of Rash   1. Irritant contact dermatitis due to plants, except food Meds ordered this encounter  Medications  . predniSONE (STERAPRED UNI-PAK 21 TAB) 10 MG (21) TBPK tablet    Sig: As directed x 6 days    Dispense:  21 tablet    Refill:  0    Order Specific Question:   Supervising Provider    Answer:   VINCENT, CAROL L [[id number]]  . sulfamethoxazole-trimethoprim (BACTRIM DS) 800-160 MG tablet    Sig: Take 1 tablet by mouth 2 (two) times daily.    Dispense:  14 tablet    Refill:  0    Order Specific Question:   Supervising Provider    Answer:    Evette Doffing, CAROL L [[id number]]   Do not scratch Zyrtec OTC Calamine lotion topically RTO prn  Mary-Margaret Hassell Done, FNP

## 2018-03-23 NOTE — Patient Instructions (Signed)
Poison Oak Dermatitis    Poison oak dermatitis is redness and soreness (inflammation) of the skin. It is caused by a chemical that is on the leaves of the poison oak plant. You may also have itching, a rash, and blisters. Symptoms often clear up in 1-2 weeks.  You may get this condition by touching a poison oak plant. You can also get it by touching something that has the chemical on it. This may include animals or objects that have come in contact with the plant.  Follow these instructions at home:  General instructions  · Take or apply over-the-counter and prescription medicines only as told by your doctor.  · If you touch poison oak, wash your skin with soap and cold water right away.  · Use hydrocortisone creams or calamine lotion as needed to help with itching.  · Take oatmeal baths as needed. Use colloidal oatmeal. You can get this at a pharmacy or grocery store. Follow the instructions on the package.  · Do not scratch or rub your skin.  · While you have the rash, wash your clothes right after you wear them.  Prevention  · Know what poison oak looks like so you can avoid it. This plant has three leaves with flowering branches on a single stem. The leaves are fuzzy. They have a toothlike edge.  · If you have touched poison oak, wash with soap and water right away. Be sure to wash under your fingernails.  · When hiking or camping, wear long pants, a long-sleeved shirt, tall socks, and hiking boots. You can also use a lotion on your skin that helps to prevent contact with the chemical on the plant.  · If you think that your clothes or outdoor gear came in contact with poison oak, rinse them off with a garden hose before you bring them inside your house.  Contact a doctor if:  · You have open sores in the rash area.  · You have more redness, swelling, or pain in the affected area.  · You have redness that spreads beyond the rash area.  · You have fluid, blood, or pus coming from the affected area.  · You have a  fever.  · You have a rash over a large area of your body.  · You have a rash on your eyes, mouth, or genitals.  · Your rash does not improve after a few days.  Get help right away if:  · Your face swells or your eyes swell shut.  · You have trouble breathing.  · You have trouble swallowing.  This information is not intended to replace advice given to you by your health care provider. Make sure you discuss any questions you have with your health care provider.  Document Released: 03/26/2010 Document Revised: 11/15/2017 Document Reviewed: 07/30/2014  Elsevier Interactive Patient Education © 2019 Elsevier Inc.

## 2018-03-30 DIAGNOSIS — S83242A Other tear of medial meniscus, current injury, left knee, initial encounter: Secondary | ICD-10-CM | POA: Diagnosis not present

## 2018-03-30 DIAGNOSIS — S83282A Other tear of lateral meniscus, current injury, left knee, initial encounter: Secondary | ICD-10-CM | POA: Diagnosis not present

## 2018-03-30 DIAGNOSIS — G8918 Other acute postprocedural pain: Secondary | ICD-10-CM | POA: Diagnosis not present

## 2018-03-30 DIAGNOSIS — X503XXA Overexertion from repetitive movements, initial encounter: Secondary | ICD-10-CM | POA: Diagnosis not present

## 2018-03-30 DIAGNOSIS — M94262 Chondromalacia, left knee: Secondary | ICD-10-CM | POA: Diagnosis not present

## 2018-04-04 DIAGNOSIS — Z1231 Encounter for screening mammogram for malignant neoplasm of breast: Secondary | ICD-10-CM | POA: Diagnosis not present

## 2018-04-04 LAB — HM MAMMOGRAPHY

## 2018-04-05 ENCOUNTER — Ambulatory Visit (HOSPITAL_COMMUNITY)
Admission: RE | Admit: 2018-04-05 | Discharge: 2018-04-05 | Disposition: A | Discharge status: Home / Self Care | Payer: Medicare HMO | Source: Ambulatory Visit | Attending: Orthopedic Surgery | Admitting: Orthopedic Surgery

## 2018-04-05 ENCOUNTER — Other Ambulatory Visit: Payer: Self-pay | Admitting: Physician Assistant

## 2018-04-05 ENCOUNTER — Other Ambulatory Visit (HOSPITAL_COMMUNITY): Payer: Self-pay | Admitting: Orthopedic Surgery

## 2018-04-05 DIAGNOSIS — M79605 Pain in left leg: Secondary | ICD-10-CM

## 2018-04-05 DIAGNOSIS — S83242D Other tear of medial meniscus, current injury, left knee, subsequent encounter: Secondary | ICD-10-CM | POA: Diagnosis not present

## 2018-04-05 DIAGNOSIS — S83282D Other tear of lateral meniscus, current injury, left knee, subsequent encounter: Secondary | ICD-10-CM | POA: Diagnosis not present

## 2018-04-05 DIAGNOSIS — M7989 Other specified soft tissue disorders: Principal | ICD-10-CM

## 2018-04-05 NOTE — Progress Notes (Signed)
Left lower extremity venous duplex has been completed.   Left lower extremity is positive for DVT in the ptv and pero v.   Preliminary results in CV Proc.   Carol Hoover 04/05/2018 10:14 AM

## 2018-04-05 NOTE — Progress Notes (Signed)
Patient is one week s/p left knee arthroscopy.  She is on progesterone and estrogen.  Left leg pain and swelling.  Gay Moncivais A. Kaleen Mask Physician Assistant [facility] [phone]  04/05/2018, 9:27 AM

## 2018-04-10 ENCOUNTER — Telehealth: Payer: Self-pay | Admitting: Physician Assistant

## 2018-04-10 ENCOUNTER — Other Ambulatory Visit: Payer: Self-pay | Admitting: Physician Assistant

## 2018-04-10 MED ORDER — TRAMADOL HCL 50 MG PO TABS: 50.0000 mg | ORAL_TABLET | Freq: Three times a day (TID) | ORAL | 1 refills | Status: AC | PRN

## 2018-04-10 NOTE — Telephone Encounter (Signed)
So refill the tramadol?

## 2018-04-10 NOTE — Telephone Encounter (Signed)
Patient has arthroscopy a couple weeks ago. She developed a dvt and she is still complaining with a deep ache and throb. They started her on eliquis at [facility].

## 2018-04-10 NOTE — Telephone Encounter (Signed)
Pt aware - almost out of tramadol - would like this sent to [facility], [place]

## 2018-04-10 NOTE — Telephone Encounter (Signed)
She is wanting to know if this normal or what she should expect with this.

## 2018-04-10 NOTE — Telephone Encounter (Signed)
Sent med

## 2018-04-10 NOTE — Telephone Encounter (Signed)
Yes, it can ache for a while. Tylenol ok. Ultram if needed okay.

## 2018-04-12 ENCOUNTER — Ambulatory Visit: Payer: Medicare HMO | Admitting: Physician Assistant

## 2018-04-13 DIAGNOSIS — S83282D Other tear of lateral meniscus, current injury, left knee, subsequent encounter: Secondary | ICD-10-CM | POA: Diagnosis not present

## 2018-04-13 DIAGNOSIS — S83242D Other tear of medial meniscus, current injury, left knee, subsequent encounter: Secondary | ICD-10-CM | POA: Diagnosis not present

## 2018-04-13 DIAGNOSIS — M25562 Pain in left knee: Secondary | ICD-10-CM | POA: Diagnosis not present

## 2018-04-17 DIAGNOSIS — R69 Illness, unspecified: Secondary | ICD-10-CM | POA: Diagnosis not present

## 2018-04-19 DIAGNOSIS — M23201 Derangement of unspecified lateral meniscus due to old tear or injury, left knee: Secondary | ICD-10-CM | POA: Diagnosis not present

## 2018-04-21 DIAGNOSIS — M23201 Derangement of unspecified lateral meniscus due to old tear or injury, left knee: Secondary | ICD-10-CM | POA: Diagnosis not present

## 2018-04-23 DIAGNOSIS — S83242D Other tear of medial meniscus, current injury, left knee, subsequent encounter: Secondary | ICD-10-CM | POA: Diagnosis not present

## 2018-04-23 DIAGNOSIS — S83282D Other tear of lateral meniscus, current injury, left knee, subsequent encounter: Secondary | ICD-10-CM | POA: Diagnosis not present

## 2018-04-25 ENCOUNTER — Ambulatory Visit (INDEPENDENT_AMBULATORY_CARE_PROVIDER_SITE_OTHER): Payer: Medicare HMO | Admitting: Physician Assistant

## 2018-04-25 ENCOUNTER — Encounter: Payer: Self-pay | Admitting: Physician Assistant

## 2018-04-25 VITALS — BP 123/58 | HR 82 | Temp 97.6°F | Ht 65.0 in | Wt 133.0 lb

## 2018-04-25 DIAGNOSIS — M23201 Derangement of unspecified lateral meniscus due to old tear or injury, left knee: Secondary | ICD-10-CM | POA: Diagnosis not present

## 2018-04-25 DIAGNOSIS — I82452 Acute embolism and thrombosis of left peroneal vein: Secondary | ICD-10-CM | POA: Insufficient documentation

## 2018-04-25 DIAGNOSIS — I82432 Acute embolism and thrombosis of left popliteal vein: Secondary | ICD-10-CM | POA: Diagnosis not present

## 2018-04-25 MED ORDER — ELIQUIS 5 MG PO TABS: 5.0000 mg | ORAL_TABLET | Freq: Two times a day (BID) | ORAL | 5 refills | Status: DC

## 2018-04-25 NOTE — Progress Notes (Signed)
BP (!) 123/58   Pulse 82   Temp 97.6 F (36.4 C) (Oral)   Ht 5\' 5"  (1.651 m)   Wt 133 lb (60.3 kg)   BMI 22.13 kg/m    Subjective:    Patient ID: Carol Hoover Signs, female    DOB: [birth year], 68 y.o.   MRN: [id number]  HPI: Carol Hoover is a 68 y.o. female presenting on 04/25/2018 for Follow-up  Patient comes in for follow-up on her DVT.  She did have arthroscopic surgery performed on her left knee.  And within a day or so she had significant pain and swelling in the posterior section of the knee and down into the calf.  She said that was 1 of the worst pains that she had ever had.  Positive ultrasound shows a DVT of posterior popliteal vein and left peroneal vein.  She was started on Eliquis.  She is discontinued any hormonal treatment that she had been taking.  We will not start her back on anything oral.  If she begins to have any vaginal symptoms we may use some intermittent topical estrogen cream.    She is still having a lot of difficulty with her knee.  She thought she would be getting better very quickly.  She is very active and is somewhat concerned that she has not bounced back fast off with this.  She continues with a lot of swelling in the leg.  They have drawn fluid off once.  She is trying to do the physical therapy but it hurts significantly.  No past medical history on file. Relevant past medical, surgical, family and social history reviewed and updated as indicated. Interim medical history since our last visit reviewed. Allergies and medications reviewed and updated. DATA REVIEWED: CHART IN EPIC  Family History reviewed for pertinent findings.  Review of Systems  Constitutional: Negative.   HENT: Negative.   Eyes: Negative.   Respiratory: Negative.   Gastrointestinal: Negative.   Genitourinary: Negative.   Musculoskeletal: Positive for arthralgias and joint swelling. Negative for back pain.    Allergies as of 04/25/2018      Reactions   Codeine Nausea Only      Medication List       Accurate as of April 25, 2018  5:37 PM. Always use your most recent med list.        ELIQUIS 5 MG Tabs tablet Generic drug:  apixaban Take 1 tablet (5 mg total) by mouth 2 (two) times daily.   RESTASIS MULTIDOSE 0.05 % ophthalmic emulsion Generic drug:  cycloSPORINE   traMADol 50 MG tablet Commonly known as:  ULTRAM Take 1 tablet (50 mg total) by mouth every 8 (eight) hours as needed.   valACYclovir 1000 MG tablet Commonly known as:  VALTREX TAKE 1 TABLET BY MOUTH TWICE DAILY FOR  PROVENTION          Objective:    BP (!) 123/58   Pulse 82   Temp 97.6 F (36.4 C) (Oral)   Ht 5\' 5"  (1.651 m)   Wt 133 lb (60.3 kg)   BMI 22.13 kg/m   Allergies  Allergen Reactions  . Codeine Nausea Only    Wt Readings from Last 3 Encounters:  04/25/18 133 lb (60.3 kg)  03/23/18 133 lb (60.3 kg)  02/09/18 132 lb 3.2 oz (60 kg)    Physical Exam Constitutional:      Appearance: She is well-developed.  HENT:     Head: Normocephalic and atraumatic.  Eyes:     Conjunctiva/sclera: Conjunctivae normal.     Pupils: Pupils are equal, round, and reactive to light.  Cardiovascular:     Rate and Rhythm: Normal rate and regular rhythm.     Heart sounds: Normal heart sounds.  Pulmonary:     Effort: Pulmonary effort is normal.     Breath sounds: Normal breath sounds.  Abdominal:     General: Bowel sounds are normal.     Palpations: Abdomen is soft.  Musculoskeletal:     Left knee: She exhibits decreased range of motion and swelling. Tenderness found.       Legs:     Comments: Continue mild discomfort on the posterior portion of the left knee.  Skin:    General: Skin is warm and dry.     Findings: No rash.  Neurological:     Mental Status: She is alert and oriented to person, place, and time.     Deep Tendon Reflexes: Reflexes are normal and symmetric.  Psychiatric:        Behavior: Behavior normal.        Thought Content: Thought content normal.         Judgment: Judgment normal.         Assessment & Plan:   1. Acute deep vein thrombosis (DVT) of popliteal vein of left lower extremity (HCC) - ELIQUIS 5 MG TABS tablet; Take 1 tablet (5 mg total) by mouth 2 (two) times daily.  Dispense: 60 tablet; Refill: 5  2. Acute deep vein thrombosis (DVT) of left peroneal vein - ELIQUIS 5 MG TABS tablet; Take 1 tablet (5 mg total) by mouth 2 (two) times daily.  Dispense: 60 tablet; Refill: 5   Continue all other maintenance medications as listed above.  Follow up plan: Return in about 3 months (around 07/24/2018).  Educational handout given for [place] PA-C [facility] [address]  [place], [place] [address] [phone]   04/25/2018, 5:37 PM

## 2018-04-28 DIAGNOSIS — M23201 Derangement of unspecified lateral meniscus due to old tear or injury, left knee: Secondary | ICD-10-CM | POA: Diagnosis not present

## 2018-05-01 DIAGNOSIS — M23201 Derangement of unspecified lateral meniscus due to old tear or injury, left knee: Secondary | ICD-10-CM | POA: Diagnosis not present

## 2018-05-04 ENCOUNTER — Telehealth: Payer: Self-pay | Admitting: Physician Assistant

## 2018-05-04 DIAGNOSIS — I82452 Acute embolism and thrombosis of left peroneal vein: Secondary | ICD-10-CM

## 2018-05-04 DIAGNOSIS — I82432 Acute embolism and thrombosis of left popliteal vein: Secondary | ICD-10-CM

## 2018-05-04 MED ORDER — ELIQUIS 5 MG PO TABS: 5.0000 mg | ORAL_TABLET | Freq: Two times a day (BID) | ORAL | 5 refills | Status: DC

## 2018-05-04 NOTE — Telephone Encounter (Signed)
Carol Hoover had sent Eliquis refills to [facility] instead of [facility] on 04/25/2018.  I contacted [facility] and they will back the prescription out of their system.  I have sent new prescription with refills to [facility].  Patient aware.

## 2018-05-08 ENCOUNTER — Telehealth: Payer: Self-pay | Admitting: Physician Assistant

## 2018-05-08 NOTE — Telephone Encounter (Signed)
yes

## 2018-05-08 NOTE — Telephone Encounter (Signed)
Patient aware.

## 2018-05-15 ENCOUNTER — Other Ambulatory Visit: Payer: Self-pay | Admitting: *Deleted

## 2018-05-15 ENCOUNTER — Telehealth: Payer: Self-pay | Admitting: Physician Assistant

## 2018-05-15 DIAGNOSIS — I82432 Acute embolism and thrombosis of left popliteal vein: Secondary | ICD-10-CM

## 2018-05-15 DIAGNOSIS — I82452 Acute embolism and thrombosis of left peroneal vein: Secondary | ICD-10-CM

## 2018-05-15 NOTE — Telephone Encounter (Signed)
Patient would like to see a vascular specialist.  Referral placed

## 2018-05-15 NOTE — Telephone Encounter (Signed)
Another vascular study can be done, referral to vascular specialist also

## 2018-05-15 NOTE — Telephone Encounter (Signed)
thank you !!

## 2018-05-22 ENCOUNTER — Other Ambulatory Visit: Payer: Self-pay | Admitting: Physician Assistant

## 2018-05-22 ENCOUNTER — Telehealth: Payer: Self-pay | Admitting: Physician Assistant

## 2018-05-22 MED ORDER — GABAPENTIN 100 MG PO CAPS: 100.0000 mg | ORAL_CAPSULE | Freq: Every day | ORAL | 3 refills | Status: DC

## 2018-05-22 NOTE — Telephone Encounter (Signed)
Sent script for GABAPENTIN 100-300 mg QHS. Try to titrate up every 3 days if possible

## 2018-05-22 NOTE — Telephone Encounter (Signed)
Has to be seen for change in pain medication

## 2018-05-22 NOTE — Telephone Encounter (Signed)
Patient aware.

## 2018-05-23 ENCOUNTER — Other Ambulatory Visit: Payer: Self-pay | Admitting: Physician Assistant

## 2018-05-23 MED ORDER — GABAPENTIN 100 MG PO CAPS: 100.0000 mg | ORAL_CAPSULE | Freq: Every day | ORAL | 3 refills | Status: DC

## 2018-05-23 NOTE — Telephone Encounter (Signed)
Med sent to [place] and patient notified

## 2018-05-29 DIAGNOSIS — S83242D Other tear of medial meniscus, current injury, left knee, subsequent encounter: Secondary | ICD-10-CM | POA: Diagnosis not present

## 2018-06-11 ENCOUNTER — Ambulatory Visit (INDEPENDENT_AMBULATORY_CARE_PROVIDER_SITE_OTHER): Payer: Medicare HMO | Admitting: Physician Assistant

## 2018-06-11 ENCOUNTER — Other Ambulatory Visit: Payer: Self-pay

## 2018-06-11 ENCOUNTER — Encounter: Payer: Self-pay | Admitting: Physician Assistant

## 2018-06-11 DIAGNOSIS — Z862 Personal history of diseases of the blood and blood-forming organs and certain disorders involving the immune mechanism: Secondary | ICD-10-CM | POA: Diagnosis not present

## 2018-06-11 NOTE — Progress Notes (Signed)
     Telephone visit  Subjective: CC:not bleeding PCP: Terald Sleeper, PA-C Carol Hoover is a 68 y.o. female calls for telephone consult today. Patient provides verbal consent for consult held via phone.  Patient is identified with 2 separate identifiers.  At this time the entire area is on COVID-19 social distancing and stay home orders are in place.  Patient is of higher risk and therefore we are performing this by a virtual method.  Location of provider: home Location of patient: home Others present for call: no   04/25/18 visit Patient comes in for follow-up on her DVT.  She did have arthroscopic surgery performed on her left knee.  And within a day or so she had significant pain and swelling in the posterior section of the knee and down into the calf.  She said that was 1 of the worst pains that she had ever had.  Positive ultrasound shows a DVT of posterior popliteal vein and left peroneal vein.  She was started on Eliquis.  She is discontinued any hormonal treatment that she had been taking.  We will not start her back on anything oral.  If she begins to have any vaginal symptoms we may use some intermittent topical estrogen cream.     Patient called today because she has noticed that she is not bleeding in a more profuse manner, which she expected when she had started the Eliquis.  As noted above she had a DVT and was started on Eliquis.  Over the weekend she was cutting back on trees and had a big scrape down her shin and had very little bleeding and barely any bruising.  Also she was cutting knife with a serrated edge knife and cut her hand.  She states that it hurts significantly but it did not bleed she even gave it a few seconds to start.  She put a Band-Aid on it and only had a dot of blood when she took the Band-Aid off later.  She reports that she is still having discomfort in the DVT.  ROS: Per HPI  Allergies  Allergen Reactions  . Codeine Nausea Only   No past  medical history on file.  Current Outpatient Medications:  .  ELIQUIS 5 MG TABS tablet, Take 1 tablet (5 mg total) by mouth 2 (two) times daily., Disp: 60 tablet, Rfl: 5 .  gabapentin (NEURONTIN) 100 MG capsule, Take 1-3 capsules (100-300 mg total) by mouth at bedtime. Titrate up every 3 days as tolerated, if sleepy reduce dose, Disp: 90 capsule, Rfl: 3 .  RESTASIS MULTIDOSE 0.05 % ophthalmic emulsion, , Disp: , Rfl: 1 .  traMADol (ULTRAM) 50 MG tablet, Take 1 tablet (50 mg total) by mouth every 8 (eight) hours as needed., Disp: 60 tablet, Rfl: 1 .  valACYclovir (VALTREX) 1000 MG tablet, TAKE 1 TABLET BY MOUTH TWICE DAILY FOR  PROVENTION, Disp: 30 tablet, Rfl: 5  Assessment/ Plan: 68 y.o. female   1. History of hypercoagulable state - Ambulatory referral to Hematology   Start time: 10:33 AM End time: 10:43 AM  No orders of the defined types were placed in this encounter.   Particia Nearing PA-C [facility] [phone]

## 2018-06-20 ENCOUNTER — Telehealth: Payer: Self-pay | Admitting: Hematology & Oncology

## 2018-06-20 NOTE — Telephone Encounter (Signed)
sw pt to confirm new patient appt 5/15 at 130 pm. Pt aware of location /date/time

## 2018-06-26 DIAGNOSIS — S83242D Other tear of medial meniscus, current injury, left knee, subsequent encounter: Secondary | ICD-10-CM | POA: Diagnosis not present

## 2018-06-26 DIAGNOSIS — M25561 Pain in right knee: Secondary | ICD-10-CM | POA: Diagnosis not present

## 2018-07-18 ENCOUNTER — Ambulatory Visit (INDEPENDENT_AMBULATORY_CARE_PROVIDER_SITE_OTHER): Payer: Medicare HMO | Admitting: Family Medicine

## 2018-07-18 ENCOUNTER — Other Ambulatory Visit: Payer: Self-pay

## 2018-07-18 VITALS — BP 130/61 | HR 63 | Temp 98.1°F | Ht 65.0 in | Wt 130.0 lb

## 2018-07-18 DIAGNOSIS — Z23 Encounter for immunization: Secondary | ICD-10-CM

## 2018-07-18 DIAGNOSIS — L03012 Cellulitis of left finger: Secondary | ICD-10-CM | POA: Diagnosis not present

## 2018-07-18 MED ORDER — CEPHALEXIN 500 MG PO CAPS: 500.0000 mg | ORAL_CAPSULE | Freq: Four times a day (QID) | ORAL | 0 refills | Status: AC

## 2018-07-18 MED ORDER — CEFTRIAXONE SODIUM 1 G IJ SOLR
1.0000 g | Freq: Once | INTRAMUSCULAR | Status: AC
  Administered 2018-07-18: 16:00:00 1 g via INTRAMUSCULAR

## 2018-07-18 NOTE — Addendum Note (Signed)
Addended byCarrolyn Leigh on: 07/18/2018 04:30 PM   Modules accepted: Orders

## 2018-07-18 NOTE — Progress Notes (Signed)
Subjective: Carol Hoover injury PCP: Terald Sleeper, PA-C Carol Hoover is a 68 y.o. female presenting to clinic today for:  1. Finger injury Patient reports that yesterday evening she accidentally stuck her middle finger on the tip of a glass ornament she had hanging in her window.  She notes that she had a small area of redness last evening but this morning she developed substantial pain and swelling in the finger.  She has decreased range of motion secondary to swelling.  Denies any overt joint pain but does report soft tissue pain on the palmar aspect of the left hand.  No sensation changes.  No fevers.  No purulence.  No oral medications but she has applied ice to the affected area.  She is currently being treated with Eliquis for a DVT and is unable to tolerate NSAIDs.   ROS: Per HPI  Allergies  Allergen Reactions  . Codeine Nausea Only   No past medical history on file.  Current Outpatient Medications:  .  ELIQUIS 5 MG TABS tablet, Take 1 tablet (5 mg total) by mouth 2 (two) times daily., Disp: 60 tablet, Rfl: 5 .  RESTASIS MULTIDOSE 0.05 % ophthalmic emulsion, , Disp: , Rfl: 1 .  traMADol (ULTRAM) 50 MG tablet, Take 1 tablet (50 mg total) by mouth every 8 (eight) hours as needed., Disp: 60 tablet, Rfl: 1 .  valACYclovir (VALTREX) 1000 MG tablet, TAKE 1 TABLET BY MOUTH TWICE DAILY FOR  PROVENTION, Disp: 30 tablet, Rfl: 5 .  cephALEXin (KEFLEX) 500 MG capsule, Take 1 capsule (500 mg total) by mouth 4 (four) times daily for 10 days., Disp: 40 capsule, Rfl: 0 Social History   Socioeconomic History  . Marital status: Single    Spouse name: Not on file  . Number of children: Not on file  . Years of education: Not on file  . Highest education level: Not on file  Occupational History  . Not on file  Social Needs  . Financial resource strain: Not on file  . Food insecurity:    Worry: Not on file    Inability: Not on file  . Transportation needs:    Medical: Not on file   Non-medical: Not on file  Tobacco Use  . Smoking status: Former Smoker    Last attempt to quit: 03/22/1968    Years since quitting: 50.3  . Smokeless tobacco: Never Used  Substance and Sexual Activity  . Alcohol use: Yes    Comment: occasionally  . Drug use: No  . Sexual activity: Not on file  Lifestyle  . Physical activity:    Days per week: Not on file    Minutes per session: Not on file  . Stress: Not on file  Relationships  . Social connections:    Talks on phone: Not on file    Gets together: Not on file    Attends religious service: Not on file    Active member of club or organization: Not on file    Attends meetings of clubs or organizations: Not on file    Relationship status: Not on file  . Intimate partner violence:    Fear of current or ex partner: Not on file    Emotionally abused: Not on file    Physically abused: Not on file    Forced sexual activity: Not on file  Other Topics Concern  . Not on file  Social History Narrative  . Not on file   No family history on file.  Objective: Office vital signs reviewed. BP 130/61   Pulse 63   Temp 98.1 F (36.7 C) (Oral)   Ht 5\' 5"  (1.651 m)   Wt 130 lb (59 kg)   BMI 21.63 kg/m   Physical Examination:  General: Awake, alert, well nourished, No acute distress Extremities: warm, well perfused, +2 pulses bilaterally MSK:   Left hand: Punctum noted on the palmar aspect of the left middle finger about 1/2 cm above the MCP joint.  There is associated soft tissue swelling that extends to the PIP of the middle finger.  No tenderness palpation to the MCP or PIP joints.  Mild increased warmth over the soft tissue swelling noted.  No palpable fluctuance.  She has tenderness to palpation over the soft tissue swelling.  Her active range of motion is limited secondary to stiffness and pain.  She has 5/5 strength with resisted flexion and extension of the middle finger Skin: Erythema and soft tissue swelling as above Neuro:  Light touch and station grossly intact  Assessment/ Plan: 68 y.o. female   1. Cellulitis of left middle finger No evidence of tendon or joint involvement at this time.  She does appear to have a cellulitis.  I have placed her on oral Keflex 4 times daily for 10 days.  She was also given a dose of Rocephin today.  Home care instructions were reviewed.  I advised her to ice the affected area and keep it elevated.  Avoid oral NSAIDs given chronic anticoagulation.  We discussed reasons for emergent evaluation the emergency department and for reevaluation in clinic.  She voiced good understanding and will follow-up PRN.  Tetanus shot administered. - cephALEXin (KEFLEX) 500 MG capsule; Take 1 capsule (500 mg total) by mouth 4 (four) times daily for 10 days.  Dispense: 40 capsule; Refill: 0   No orders of the defined types were placed in this encounter.  Meds ordered this encounter  Medications  . cephALEXin (KEFLEX) 500 MG capsule    Sig: Take 1 capsule (500 mg total) by mouth 4 (four) times daily for 10 days.    Dispense:  40 capsule    Refill:  [place], DO [place] [phone]

## 2018-07-18 NOTE — Patient Instructions (Signed)
We discussed reasons for reevaluation and emergent evaluation.  You received a dose of Rocephin today.  I am sending in Cephalexin to take every 6 hours for the next 10 days.  Start 4 times daily dosing tomorrow.  You were given a tetanus booster today as well.  Cellulitis, Adult  Cellulitis is a skin infection. The infected area is often warm, red, swollen, and sore. It occurs most often in the arms and lower legs. It is very important to get treated for this condition. What are the causes? This condition is caused by bacteria. The bacteria enter through a break in the skin, such as a cut, burn, insect bite, open sore, or crack. What increases the risk? This condition is more likely to occur in people who:  Have a weak body defense system (immune system).  Have open cuts, burns, bites, or scrapes on the skin.  Are older than 66 years of age.  Have a blood sugar problem (diabetes).  Have a long-lasting (chronic) liver disease (cirrhosis) or kidney disease.  Are very overweight (obese).  Have a skin problem, such as: ? Itchy rash (eczema). ? Slow movement of blood in the veins (venous stasis). ? Fluid buildup below the skin (edema).  Have been treated with high-energy rays (radiation).  Use IV drugs. What are the signs or symptoms? Symptoms of this condition include:  Skin that is: ? Red. ? Streaking. ? Spotting. ? Swollen. ? Sore or painful when you touch it. ? Warm.  A fever.  Chills.  Blisters. How is this diagnosed? This condition is diagnosed based on:  Medical history.  Physical exam.  Blood tests.  Imaging tests. How is this treated? Treatment for this condition may include:  Medicines to treat infections or allergies.  Home care, such as: ? Rest. ? Placing cold or warm cloths (compresses) on the skin.  Hospital care, if the condition is very bad. Follow these instructions at home: Medicines  Take over-the-counter and prescription medicines  only as told by your doctor.  If you were prescribed an antibiotic medicine, take it as told by your doctor. Do not stop taking it even if you start to feel better. General instructions   Drink enough fluid to keep your pee (urine) pale yellow.  Do not touch or rub the infected area.  Raise (elevate) the infected area above the level of your heart while you are sitting or lying down.  Place cold or warm cloths on the area as told by your doctor.  Keep all follow-up visits as told by your doctor. This is important. Contact a doctor if:  You have a fever.  You do not start to get better after 1-2 days of treatment.  Your bone or joint under the infected area starts to hurt after the skin has healed.  Your infection comes back. This can happen in the same area or another area.  You have a swollen bump in the area.  You have new symptoms.  You feel ill and have muscle aches and pains. Get help right away if:  Your symptoms get worse.  You feel very sleepy.  You throw up (vomit) or have watery poop (diarrhea) for a long time.  You see red streaks coming from the area.  Your red area gets larger.  Your red area turns dark in color. These symptoms may represent a serious problem that is an emergency. Do not wait to see if the symptoms will go away. Get medical help right away.  Call your local emergency services (911 in the [place] Do not drive yourself to the hospital. Summary  Cellulitis is a skin infection. The area is often warm, red, swollen, and sore.  This condition is treated with medicines, rest, and cold and warm cloths.  Take all medicines only as told by your doctor.  Tell your doctor if symptoms do not start to get better after 1-2 days of treatment. This information is not intended to replace advice given to you by your health care provider. Make sure you discuss any questions you have with your health care provider. Document Released: 08/10/2007 Document  Revised: 07/13/2017 Document Reviewed: 07/13/2017 Elsevier Interactive Patient Education  2019 [employer].

## 2018-07-19 DIAGNOSIS — R600 Localized edema: Secondary | ICD-10-CM | POA: Diagnosis not present

## 2018-07-19 DIAGNOSIS — M79662 Pain in left lower leg: Secondary | ICD-10-CM | POA: Diagnosis not present

## 2018-07-19 DIAGNOSIS — Z86718 Personal history of other venous thrombosis and embolism: Secondary | ICD-10-CM | POA: Diagnosis not present

## 2018-07-20 ENCOUNTER — Inpatient Hospital Stay: Payer: Medicare HMO | Attending: Hematology & Oncology

## 2018-07-20 ENCOUNTER — Inpatient Hospital Stay (HOSPITAL_BASED_OUTPATIENT_CLINIC_OR_DEPARTMENT_OTHER): Payer: Medicare HMO | Admitting: Hematology & Oncology

## 2018-07-20 ENCOUNTER — Other Ambulatory Visit: Payer: Self-pay

## 2018-07-20 ENCOUNTER — Encounter: Payer: Self-pay | Admitting: Hematology & Oncology

## 2018-07-20 VITALS — BP 128/56 | HR 74 | Temp 98.3°F | Resp 18 | Ht 65.0 in | Wt 128.0 lb

## 2018-07-20 DIAGNOSIS — Z87891 Personal history of nicotine dependence: Secondary | ICD-10-CM

## 2018-07-20 DIAGNOSIS — Z01419 Encounter for gynecological examination (general) (routine) without abnormal findings: Secondary | ICD-10-CM

## 2018-07-20 DIAGNOSIS — Z79899 Other long term (current) drug therapy: Secondary | ICD-10-CM

## 2018-07-20 DIAGNOSIS — Z7901 Long term (current) use of anticoagulants: Secondary | ICD-10-CM

## 2018-07-20 DIAGNOSIS — I82432 Acute embolism and thrombosis of left popliteal vein: Secondary | ICD-10-CM | POA: Insufficient documentation

## 2018-07-20 LAB — CBC WITH DIFFERENTIAL (CANCER CENTER ONLY)
Abs Immature Granulocytes: 0.02 10*3/uL (ref 0.00–0.07)
Basophils Absolute: 0 10*3/uL (ref 0.0–0.1)
Basophils Relative: 1 %
Eosinophils Absolute: 0.1 10*3/uL (ref 0.0–0.5)
Eosinophils Relative: 2 %
HCT: 43 % (ref 36.0–46.0)
Hemoglobin: 14.2 g/dL (ref 12.0–15.0)
Immature Granulocytes: 0 %
Lymphocytes Relative: 26 %
Lymphs Abs: 1.6 10*3/uL (ref 0.7–4.0)
MCH: 29.6 pg (ref 26.0–34.0)
MCHC: 33 g/dL (ref 30.0–36.0)
MCV: 89.8 fL (ref 80.0–100.0)
Monocytes Absolute: 0.4 10*3/uL (ref 0.1–1.0)
Monocytes Relative: 6 %
Neutro Abs: 4.1 10*3/uL (ref 1.7–7.7)
Neutrophils Relative %: 65 %
Platelet Count: 254 10*3/uL (ref 150–400)
RBC: 4.79 MIL/uL (ref 3.87–5.11)
RDW: 12.4 % (ref 11.5–15.5)
WBC Count: 6.2 10*3/uL (ref 4.0–10.5)
nRBC: 0 % (ref 0.0–0.2)

## 2018-07-20 LAB — CMP (CANCER CENTER ONLY)
ALT: 12 U/L (ref 0–44)
AST: 16 U/L (ref 15–41)
Albumin: 4.3 g/dL (ref 3.5–5.0)
Alkaline Phosphatase: 74 U/L (ref 38–126)
Anion gap: 8 (ref 5–15)
BUN: 17 mg/dL (ref 8–23)
CO2: 29 mmol/L (ref 22–32)
Calcium: 9.1 mg/dL (ref 8.9–10.3)
Chloride: 103 mmol/L (ref 98–111)
Creatinine: 0.61 mg/dL (ref 0.44–1.00)
GFR, Est AFR Am: >60 mL/min (ref >60)
GFR, Estimated: >60 mL/min (ref >60)
Glucose, Bld: 89 mg/dL (ref 70–99)
Potassium: 3.7 mmol/L (ref 3.5–5.1)
Sodium: 140 mmol/L (ref 135–145)
Total Bilirubin: 0.4 mg/dL (ref 0.3–1.2)
Total Protein: 6.6 g/dL (ref 6.5–8.1)

## 2018-07-20 LAB — ANTITHROMBIN III: AntiThromb III Func: 112 % (ref 75–120)

## 2018-07-20 NOTE — Progress Notes (Signed)
Referral MD  Reason for Referral: DVT of the LEFT lower leg  Chief Complaint  Patient presents with  . New Patient (Initial Visit)  :   HPI: Ms. Carol Hoover is a very charming 68 year old white female.  She certainly looks a lot younger.  She has a very interesting job.  She is a Air traffic controller.  I talked to her at length about this.  It was just fascinating that here the story is that she told.  She is a Insurance underwriter.  She has hiked the [facility].  She just like to go hiking.  It is obvious that she is in very good shape.  She had the surgery for the left knee back in January.  She apparently had a meniscal tear.  A week afterwards, she had pain and swelling in the left lower leg.  She underwent a Doppler which confirmed a thrombus in the left popliteal vein and left peroneal vein.  She was then placed on Eliquis.  She is done well on Eliquis.  Her doctor was worried that she may have a hypercoagulable state.  There is no blood clots in the family.  She has had no prior surgery.  Of note, she was on low-dose estrogen and progesterone.  She does not smoke.  She is not diabetic.  She says she had a Doppler done yesterday.  This was done in [place].  She says that there was no blood clot noted.  She was kindly referred to the [facility] for an evaluation.  Again, she is never had surgery before.  She says that when she cuts herself that there is no bleeding despite being on Eliquis.  She has had no change in bowel or bladder habits.  Last mammogram was back in January she said.  There is no weight loss.  She had no weight gain.  Overall, her performance status is ECOG 2.  .   History reviewed. No pertinent past medical history.:  Past Surgical History:  Procedure Laterality Date  . TUBAL LIGATION    :   Current Outpatient Medications:  .  cephALEXin (KEFLEX) 500 MG capsule, Take 1 capsule (500 mg total) by mouth 4 (four) times daily for 10 days., Disp: 40  capsule, Rfl: 0 .  ELIQUIS 5 MG TABS tablet, Take 1 tablet (5 mg total) by mouth 2 (two) times daily., Disp: 60 tablet, Rfl: 5 .  RESTASIS MULTIDOSE 0.05 % ophthalmic emulsion, , Disp: , Rfl: 1 .  traMADol (ULTRAM) 50 MG tablet, Take 1 tablet (50 mg total) by mouth every 8 (eight) hours as needed., Disp: 60 tablet, Rfl: 1 .  valACYclovir (VALTREX) 1000 MG tablet, TAKE 1 TABLET BY MOUTH TWICE DAILY FOR  PROVENTION, Disp: 30 tablet, Rfl: 5:  :  Allergies  Allergen Reactions  . Codeine Nausea Only  :  History reviewed. No pertinent family history.:  Social History   Socioeconomic History  . Marital status: Single    Spouse name: Not on file  . Number of children: Not on file  . Years of education: Not on file  . Highest education level: Not on file  Occupational History  . Not on file  Social Needs  . Financial resource strain: Not on file  . Food insecurity:    Worry: Not on file    Inability: Not on file  . Transportation needs:    Medical: Not on file    Non-medical: Not on file  Tobacco Use  .  Smoking status: Former Smoker    Last attempt to quit: 03/22/1968    Years since quitting: 50.3  . Smokeless tobacco: Never Used  Substance and Sexual Activity  . Alcohol use: Yes    Comment: occasionally  . Drug use: No  . Sexual activity: Not on file  Lifestyle  . Physical activity:    Days per week: Not on file    Minutes per session: Not on file  . Stress: Not on file  Relationships  . Social connections:    Talks on phone: Not on file    Gets together: Not on file    Attends religious service: Not on file    Active member of club or organization: Not on file    Attends meetings of clubs or organizations: Not on file    Relationship status: Not on file  . Intimate partner violence:    Fear of current or ex partner: Not on file    Emotionally abused: Not on file    Physically abused: Not on file    Forced sexual activity: Not on file  Other Topics Concern  . Not  on file  Social History Narrative  . Not on file  : Review of Systems  Constitutional: Negative.   HENT: Negative.   Eyes: Negative.   Respiratory: Negative.   Cardiovascular: Negative.   Gastrointestinal: Negative.   Genitourinary: Negative.   Musculoskeletal: Negative.   Skin: Negative.   Neurological: Negative.   Endo/Heme/Allergies: Negative.   Psychiatric/Behavioral: Negative.    .  Exam: Well-developed well-nourished white female in no obvious distress.  Vital signs are temperature of 98.3.  Pulse 74.  Blood pressure 128/56.  Weight is 128 pounds.  Head neck exam shows no ocular or oral lesions.  She has no scleral icterus.  No adenopathy in the neck.  Lungs are clear bilaterally.  Cardiac exam regular rate and rhythm with no murmurs, rubs or bruits.  Abdomen is soft.  She has good bowel sounds.  There is no fluid wave.  There is no palpable liver or spleen tip.  Back exam shows no tenderness over the spine, ribs or hips.  Extremities shows no clubbing, cyanosis or edema.  She has no palpable venous cord in the legs.  She has a negative Homans sign.  She has good pulses in her distal extremities.  Neurological exam is nonfocal.  Skin exam shows no rashes  @IPVITALS @   Recent Labs    07/20/18 1338  WBC 6.2  HGB 14.2  HCT 43.0  PLT 254   Recent Labs    07/20/18 1338  NA 140  K 3.7  CL 103  CO2 29  GLUCOSE 89  BUN 17  CREATININE 0.61  CALCIUM 9.1    Blood smear review: None  Pathology: None    Assessment and Plan: Ms. Carol Hoover is a very charming 68 year old white female.  She has a thrombus in the left leg.  She says this is resolved now.  I would keep her on full dose anticoagulation for 6 months.  As such, she will finish up in August.  We will plan to see her back in August.  I doubt that she has a hypercoagulable state.  She may have a lupus anticoagulant which seems to be relatively common these days.  Even if she has a hypercoagulable condition, I  certainly would not keep her on long-term anticoagulation.  This is her first thromboembolic event so I would treat this somewhat conservatively.  I  spent about an hour with her.  She is very nice.  I went over my recommendations.  I answered all of her questions.

## 2018-07-21 LAB — PROTEIN C, TOTAL: Protein C, Total: 119 % (ref 60–150)

## 2018-07-21 LAB — BETA-2-GLYCOPROTEIN I ABS, IGG/M/A
Beta-2 Glyco I IgG: <9 GPI IgG units (ref 0–20)
Beta-2-Glycoprotein I IgA: <9 GPI IgA units (ref 0–25)
Beta-2-Glycoprotein I IgM: <9 GPI IgM units (ref 0–32)

## 2018-07-21 LAB — CARDIOLIPIN ANTIBODIES, IGG, IGM, IGA
Anticardiolipin IgA: <9 APL U/mL (ref 0–11)
Anticardiolipin IgG: <9 GPL U/mL (ref 0–14)
Anticardiolipin IgM: <9 MPL U/mL (ref 0–12)

## 2018-07-21 LAB — HOMOCYSTEINE: Homocysteine: 8 umol/L (ref 0.0–17.2)

## 2018-07-23 LAB — PROTEIN S ACTIVITY: Protein S Activity: 106 % (ref 63–140)

## 2018-07-23 LAB — PROTEIN S, TOTAL: Protein S Ag, Total: 45 % — ABNORMAL LOW (ref 60–150)

## 2018-07-23 LAB — PROTEIN C ACTIVITY: Protein C Activity: 130 % (ref 73–180)

## 2018-07-24 DIAGNOSIS — S83242D Other tear of medial meniscus, current injury, left knee, subsequent encounter: Secondary | ICD-10-CM | POA: Diagnosis not present

## 2018-07-24 LAB — DRVVT CONFIRM: dRVVT Confirm: 1 ratio (ref 0.8–1.2)

## 2018-07-24 LAB — LUPUS ANTICOAGULANT PANEL
DRVVT: 63.9 s — ABNORMAL HIGH (ref 0.0–47.0)
PTT Lupus Anticoagulant: 32.5 s (ref 0.0–51.9)

## 2018-07-24 LAB — DRVVT MIX: dRVVT Mix: 48.8 s — ABNORMAL HIGH (ref 0.0–47.0)

## 2018-07-26 ENCOUNTER — Inpatient Hospital Stay: Payer: Medicare HMO

## 2018-07-26 ENCOUNTER — Ambulatory Visit: Payer: Medicare HMO | Admitting: Hematology

## 2018-07-26 LAB — PROTHROMBIN GENE MUTATION

## 2018-07-27 LAB — FACTOR 5 LEIDEN

## 2018-08-02 ENCOUNTER — Other Ambulatory Visit: Payer: Self-pay | Admitting: Hematology & Oncology

## 2018-08-02 DIAGNOSIS — I82432 Acute embolism and thrombosis of left popliteal vein: Secondary | ICD-10-CM

## 2018-08-06 DIAGNOSIS — N951 Menopausal and female climacteric states: Secondary | ICD-10-CM | POA: Diagnosis not present

## 2018-08-09 DIAGNOSIS — N951 Menopausal and female climacteric states: Secondary | ICD-10-CM | POA: Diagnosis not present

## 2018-08-09 DIAGNOSIS — G479 Sleep disorder, unspecified: Secondary | ICD-10-CM | POA: Diagnosis not present

## 2018-08-09 DIAGNOSIS — Z86718 Personal history of other venous thrombosis and embolism: Secondary | ICD-10-CM | POA: Diagnosis not present

## 2018-08-09 DIAGNOSIS — R69 Illness, unspecified: Secondary | ICD-10-CM | POA: Diagnosis not present

## 2018-08-09 DIAGNOSIS — N898 Other specified noninflammatory disorders of vagina: Secondary | ICD-10-CM | POA: Diagnosis not present

## 2018-08-09 DIAGNOSIS — R232 Flushing: Secondary | ICD-10-CM | POA: Diagnosis not present

## 2018-08-14 DIAGNOSIS — M1712 Unilateral primary osteoarthritis, left knee: Secondary | ICD-10-CM | POA: Diagnosis not present

## 2018-08-15 DIAGNOSIS — G479 Sleep disorder, unspecified: Secondary | ICD-10-CM | POA: Diagnosis not present

## 2018-08-15 DIAGNOSIS — N951 Menopausal and female climacteric states: Secondary | ICD-10-CM | POA: Diagnosis not present

## 2018-08-15 DIAGNOSIS — N898 Other specified noninflammatory disorders of vagina: Secondary | ICD-10-CM | POA: Diagnosis not present

## 2018-08-21 DIAGNOSIS — M1712 Unilateral primary osteoarthritis, left knee: Secondary | ICD-10-CM | POA: Diagnosis not present

## 2018-08-28 DIAGNOSIS — M1712 Unilateral primary osteoarthritis, left knee: Secondary | ICD-10-CM | POA: Diagnosis not present

## 2018-09-13 DIAGNOSIS — N951 Menopausal and female climacteric states: Secondary | ICD-10-CM | POA: Diagnosis not present

## 2018-09-13 DIAGNOSIS — N898 Other specified noninflammatory disorders of vagina: Secondary | ICD-10-CM | POA: Diagnosis not present

## 2018-09-13 DIAGNOSIS — G479 Sleep disorder, unspecified: Secondary | ICD-10-CM | POA: Diagnosis not present

## 2018-09-17 DIAGNOSIS — N951 Menopausal and female climacteric states: Secondary | ICD-10-CM | POA: Diagnosis not present

## 2018-09-17 DIAGNOSIS — R69 Illness, unspecified: Secondary | ICD-10-CM | POA: Diagnosis not present

## 2018-09-17 DIAGNOSIS — G479 Sleep disorder, unspecified: Secondary | ICD-10-CM | POA: Diagnosis not present

## 2018-10-24 ENCOUNTER — Other Ambulatory Visit: Payer: Medicare HMO

## 2018-10-24 ENCOUNTER — Ambulatory Visit: Payer: Medicare HMO | Admitting: Hematology & Oncology

## 2018-10-31 DIAGNOSIS — R69 Illness, unspecified: Secondary | ICD-10-CM | POA: Diagnosis not present

## 2018-11-13 DIAGNOSIS — M1712 Unilateral primary osteoarthritis, left knee: Secondary | ICD-10-CM | POA: Diagnosis not present

## 2018-11-19 DIAGNOSIS — R69 Illness, unspecified: Secondary | ICD-10-CM | POA: Diagnosis not present

## 2018-11-19 DIAGNOSIS — N951 Menopausal and female climacteric states: Secondary | ICD-10-CM | POA: Diagnosis not present

## 2018-11-19 DIAGNOSIS — G479 Sleep disorder, unspecified: Secondary | ICD-10-CM | POA: Diagnosis not present

## 2018-11-21 DIAGNOSIS — G479 Sleep disorder, unspecified: Secondary | ICD-10-CM | POA: Diagnosis not present

## 2018-11-21 DIAGNOSIS — R232 Flushing: Secondary | ICD-10-CM | POA: Diagnosis not present

## 2018-11-21 DIAGNOSIS — N951 Menopausal and female climacteric states: Secondary | ICD-10-CM | POA: Diagnosis not present

## 2018-11-22 DIAGNOSIS — M179 Osteoarthritis of knee, unspecified: Secondary | ICD-10-CM | POA: Diagnosis not present

## 2018-11-22 DIAGNOSIS — Z79891 Long term (current) use of opiate analgesic: Secondary | ICD-10-CM | POA: Diagnosis not present

## 2018-11-22 DIAGNOSIS — M25562 Pain in left knee: Secondary | ICD-10-CM | POA: Diagnosis not present

## 2018-12-12 DIAGNOSIS — M179 Osteoarthritis of knee, unspecified: Secondary | ICD-10-CM | POA: Diagnosis not present

## 2018-12-12 DIAGNOSIS — M25562 Pain in left knee: Secondary | ICD-10-CM | POA: Diagnosis not present

## 2018-12-19 DIAGNOSIS — M179 Osteoarthritis of knee, unspecified: Secondary | ICD-10-CM | POA: Insufficient documentation

## 2018-12-19 DIAGNOSIS — Z86718 Personal history of other venous thrombosis and embolism: Secondary | ICD-10-CM | POA: Insufficient documentation

## 2018-12-19 DIAGNOSIS — M1710 Unilateral primary osteoarthritis, unspecified knee: Secondary | ICD-10-CM | POA: Insufficient documentation

## 2018-12-20 DIAGNOSIS — M1712 Unilateral primary osteoarthritis, left knee: Secondary | ICD-10-CM | POA: Diagnosis not present

## 2018-12-31 ENCOUNTER — Ambulatory Visit (INDEPENDENT_AMBULATORY_CARE_PROVIDER_SITE_OTHER): Payer: Medicare HMO | Admitting: Nurse Practitioner

## 2018-12-31 ENCOUNTER — Other Ambulatory Visit: Payer: Self-pay

## 2018-12-31 ENCOUNTER — Encounter: Payer: Self-pay | Admitting: Nurse Practitioner

## 2018-12-31 DIAGNOSIS — R1013 Epigastric pain: Secondary | ICD-10-CM | POA: Diagnosis not present

## 2018-12-31 MED ORDER — PANTOPRAZOLE SODIUM 40 MG PO TBEC: 40.0000 mg | DELAYED_RELEASE_TABLET | Freq: Every day | ORAL | 3 refills | Status: DC

## 2018-12-31 NOTE — Progress Notes (Signed)
   Virtual Visit via telephone Note Due to COVID-19 pandemic this visit was conducted virtually. This visit type was conducted due to national recommendations for restrictions regarding the COVID-19 Pandemic (e.g. social distancing, sheltering in place) in an effort to limit this patient's exposure and mitigate transmission in our community. All issues noted in this document were discussed and addressed.  A physical exam was not performed with this format.  I connected with Carol Hoover on 12/31/18 at 11:30 by telephone and verified that I am speaking with the correct person using two identifiers. Carol Hoover is currently located at home and no one is currently with her during visit. The provider, Mary-Margaret Hassell Done, FNP is located in their office at time of visit.  I discussed the limitations, risks, security and privacy concerns of performing an evaluation and management service by telephone and the availability of in person appointments. I also discussed with the patient that there may be a patient responsible charge related to this service. The patient expressed understanding and agreed to proceed.   History and Present Illness:   Chief Complaint: GI Problem   HPI Patient calls in c/o burning pain in stomach. Started several months ago. constant gnawing pain in stomach. She has a history of H pylori and this feels different. Slight nausea. Pain is worse after eating.   Review of Systems  Constitutional: Negative.   HENT: Negative.   Cardiovascular: Negative.   Gastrointestinal: Positive for abdominal pain (burning sensation).  Genitourinary: Negative.   Skin: Negative.   Neurological: Negative.   Psychiatric/Behavioral: Negative.   All other systems reviewed and are negative.    Observations/Objective: Alert and oriented- answers all questions appropriately mild distress    Assessment and Plan: Carol Hoover in today with chief complaint of GI Problem   1.  Epigastric pain Avoid spicy and fatty foods Bland diet Will talk after U/S done - pantoprazole (PROTONIX) 40 MG tablet; Take 1 tablet (40 mg total) by mouth daily.  Dispense: 30 tablet; Refill: 3 - US Abdomen Limited RUQ; Future   Follow Up Instructions: prn    I discussed the assessment and treatment plan with the patient. The patient was provided an opportunity to ask questions and all were answered. The patient agreed with the plan and demonstrated an understanding of the instructions.   The patient was advised to call back or seek an in-person evaluation if the symptoms worsen or if the condition fails to improve as anticipated.  The above assessment and management plan was discussed with the patient. The patient verbalized understanding of and has agreed to the management plan. Patient is aware to call the clinic if symptoms persist or worsen. Patient is aware when to return to the clinic for a follow-up visit. Patient educated on when it is appropriate to go to the emergency department.   Time call ended:  11:40  I provided 10 minutes of non-face-to-face time during this encounter.    Mary-Margaret Hassell Done, FNP

## 2019-01-08 ENCOUNTER — Other Ambulatory Visit: Payer: Self-pay

## 2019-01-08 ENCOUNTER — Ambulatory Visit (HOSPITAL_COMMUNITY)
Admission: RE | Admit: 2019-01-08 | Discharge: 2019-01-08 | Disposition: A | Discharge status: Home / Self Care | Payer: Medicare HMO | Source: Ambulatory Visit | Attending: Nurse Practitioner | Admitting: Nurse Practitioner

## 2019-01-08 DIAGNOSIS — R1013 Epigastric pain: Secondary | ICD-10-CM | POA: Diagnosis not present

## 2019-01-08 DIAGNOSIS — K824 Cholesterolosis of gallbladder: Secondary | ICD-10-CM | POA: Diagnosis not present

## 2019-01-08 DIAGNOSIS — K7689 Other specified diseases of liver: Secondary | ICD-10-CM | POA: Diagnosis not present

## 2019-02-08 ENCOUNTER — Ambulatory Visit (INDEPENDENT_AMBULATORY_CARE_PROVIDER_SITE_OTHER): Payer: Medicare HMO | Admitting: Family Medicine

## 2019-02-08 DIAGNOSIS — J019 Acute sinusitis, unspecified: Secondary | ICD-10-CM

## 2019-02-08 MED ORDER — PREDNISONE 10 MG (21) PO TBPK: ORAL_TABLET | ORAL | 0 refills | Status: DC

## 2019-02-08 MED ORDER — AMOXICILLIN-POT CLAVULANATE 875-125 MG PO TABS: 1.0000 | ORAL_TABLET | Freq: Two times a day (BID) | ORAL | 0 refills | Status: DC

## 2019-02-08 NOTE — Progress Notes (Signed)
   Virtual Visit via Telephone Note  I connected with Carol Hoover on 02/08/19 at 8:00 AM by telephone and verified that I am speaking with the correct person using two identifiers. Carol Hoover is currently located at work and nobody is currently with her during this visit. The provider, Loman Brooklyn, FNP is located in their home at time of visit.  I discussed the limitations, risks, security and privacy concerns of performing an evaluation and management service by telephone and the availability of in person appointments. I also discussed with the patient that there may be a patient responsible charge related to this service. The patient expressed understanding and agreed to proceed.  Subjective: PCP: Terald Sleeper, PA-C  Chief Complaint  Patient presents with  . Sinus Problem   Patient complains of headache, facial pain/pressure and postnasal drainage. Additional symptoms include runny nose, sneezing and nausea. Onset of symptoms was 1 week ago, gradually worsening since that time. She is drinking plenty of fluids. Evaluation to date: none. Treatment to date: antihistamines, nasal steroids and homeopathic "stuff". She does not have a history of asthma or COPD. She does not smoke.    ROS: Per HPI  Current Outpatient Medications:  .  ELIQUIS 5 MG TABS tablet, Take 1 tablet (5 mg total) by mouth 2 (two) times daily., Disp: 60 tablet, Rfl: 5 .  pantoprazole (PROTONIX) 40 MG tablet, Take 1 tablet (40 mg total) by mouth daily., Disp: 30 tablet, Rfl: 3 .  RESTASIS MULTIDOSE 0.05 % ophthalmic emulsion, , Disp: , Rfl: 1 .  traMADol (ULTRAM) 50 MG tablet, Take 1 tablet (50 mg total) by mouth every 8 (eight) hours as needed., Disp: 60 tablet, Rfl: 1 .  valACYclovir (VALTREX) 1000 MG tablet, TAKE 1 TABLET BY MOUTH TWICE DAILY FOR  PROVENTION, Disp: 30 tablet, Rfl: 5  Allergies  Allergen Reactions  . Codeine Nausea Only   No past medical history on file.  Observations/Objective: A&O   No respiratory distress or wheezing audible over the phone Mood, judgement, and thought processes all WNL  Assessment and Plan: 1. Acute non-recurrent sinusitis, unspecified location - Encouraged patient to get tested for COVID-19.  Symptom management.  Education provided on sinusitis. - amoxicillin-clavulanate (AUGMENTIN) 875-125 MG tablet; Take 1 tablet by mouth 2 (two) times daily.  Dispense: 14 tablet; Refill: 0 - predniSONE (STERAPRED UNI-PAK 21 TAB) 10 MG (21) TBPK tablet; Use as directed on back of pill pack  Dispense: 21 tablet; Refill: 0   Follow Up Instructions:  I discussed the assessment and treatment plan with the patient. The patient was provided an opportunity to ask questions and all were answered. The patient agreed with the plan and demonstrated an understanding of the instructions.   The patient was advised to call back or seek an in-person evaluation if the symptoms worsen or if the condition fails to improve as anticipated.  The above assessment and management plan was discussed with the patient. The patient verbalized understanding of and has agreed to the management plan. Patient is aware to call the clinic if symptoms persist or worsen. Patient is aware when to return to the clinic for a follow-up visit. Patient educated on when it is appropriate to go to the emergency department.   Time call ended: 8:09 AM  I provided 13 minutes of non-face-to-face time during this encounter.  Hendricks Limes, MSN, APRN, FNP-C [facility] 02/08/19

## 2019-02-08 NOTE — Patient Instructions (Signed)

## 2019-02-18 DIAGNOSIS — G479 Sleep disorder, unspecified: Secondary | ICD-10-CM | POA: Diagnosis not present

## 2019-02-18 DIAGNOSIS — M179 Osteoarthritis of knee, unspecified: Secondary | ICD-10-CM | POA: Diagnosis not present

## 2019-02-18 DIAGNOSIS — M791 Myalgia, unspecified site: Secondary | ICD-10-CM | POA: Diagnosis not present

## 2019-02-18 DIAGNOSIS — R232 Flushing: Secondary | ICD-10-CM | POA: Diagnosis not present

## 2019-02-18 DIAGNOSIS — M25562 Pain in left knee: Secondary | ICD-10-CM | POA: Diagnosis not present

## 2019-02-18 DIAGNOSIS — N951 Menopausal and female climacteric states: Secondary | ICD-10-CM | POA: Diagnosis not present

## 2019-02-20 DIAGNOSIS — N898 Other specified noninflammatory disorders of vagina: Secondary | ICD-10-CM | POA: Diagnosis not present

## 2019-02-20 DIAGNOSIS — R232 Flushing: Secondary | ICD-10-CM | POA: Diagnosis not present

## 2019-02-20 DIAGNOSIS — N951 Menopausal and female climacteric states: Secondary | ICD-10-CM | POA: Diagnosis not present

## 2019-03-21 DIAGNOSIS — M1712 Unilateral primary osteoarthritis, left knee: Secondary | ICD-10-CM | POA: Diagnosis not present

## 2019-03-27 DIAGNOSIS — M791 Myalgia, unspecified site: Secondary | ICD-10-CM | POA: Diagnosis not present

## 2019-04-09 DIAGNOSIS — M25562 Pain in left knee: Secondary | ICD-10-CM | POA: Diagnosis not present

## 2019-04-16 DIAGNOSIS — Z1231 Encounter for screening mammogram for malignant neoplasm of breast: Secondary | ICD-10-CM | POA: Diagnosis not present

## 2019-04-26 ENCOUNTER — Encounter: Payer: Medicare HMO | Admitting: Physician Assistant

## 2019-04-29 ENCOUNTER — Other Ambulatory Visit: Payer: Self-pay

## 2019-04-29 DIAGNOSIS — L988 Other specified disorders of the skin and subcutaneous tissue: Secondary | ICD-10-CM | POA: Diagnosis not present

## 2019-04-29 DIAGNOSIS — Z85828 Personal history of other malignant neoplasm of skin: Secondary | ICD-10-CM | POA: Diagnosis not present

## 2019-04-29 DIAGNOSIS — D225 Melanocytic nevi of trunk: Secondary | ICD-10-CM | POA: Diagnosis not present

## 2019-04-29 DIAGNOSIS — L820 Inflamed seborrheic keratosis: Secondary | ICD-10-CM | POA: Diagnosis not present

## 2019-04-29 DIAGNOSIS — L905 Scar conditions and fibrosis of skin: Secondary | ICD-10-CM | POA: Diagnosis not present

## 2019-04-29 DIAGNOSIS — D1801 Hemangioma of skin and subcutaneous tissue: Secondary | ICD-10-CM | POA: Diagnosis not present

## 2019-04-30 ENCOUNTER — Ambulatory Visit (INDEPENDENT_AMBULATORY_CARE_PROVIDER_SITE_OTHER): Payer: Medicare HMO | Admitting: Physician Assistant

## 2019-04-30 ENCOUNTER — Ambulatory Visit (INDEPENDENT_AMBULATORY_CARE_PROVIDER_SITE_OTHER): Payer: Medicare HMO

## 2019-04-30 ENCOUNTER — Encounter: Payer: Self-pay | Admitting: Physician Assistant

## 2019-04-30 VITALS — BP 113/64 | HR 76 | Temp 98.0°F | Ht 65.0 in | Wt 131.0 lb

## 2019-04-30 DIAGNOSIS — Z01818 Encounter for other preprocedural examination: Secondary | ICD-10-CM

## 2019-04-30 DIAGNOSIS — I82432 Acute embolism and thrombosis of left popliteal vein: Secondary | ICD-10-CM | POA: Diagnosis not present

## 2019-04-30 DIAGNOSIS — R7309 Other abnormal glucose: Secondary | ICD-10-CM | POA: Diagnosis not present

## 2019-04-30 LAB — URINALYSIS
Bilirubin, UA: NEGATIVE
Glucose, UA: NEGATIVE
Nitrite, UA: NEGATIVE
Protein,UA: NEGATIVE
RBC, UA: NEGATIVE
Specific Gravity, UA: >1.03 — ABNORMAL HIGH (ref 1.005–1.030)
Urobilinogen, Ur: 0.2 mg/dL (ref 0.2–1.0)
pH, UA: 5.5 (ref 5.0–7.5)

## 2019-04-30 LAB — COAGUCHEK XS/INR WAIVED
INR: 1 (ref 0.9–1.1)
Prothrombin Time: 11.6 s

## 2019-04-30 LAB — BAYER DCA HB A1C WAIVED: HB A1C (BAYER DCA - WAIVED): 5.1 %

## 2019-05-01 LAB — CBC WITH DIFFERENTIAL/PLATELET
Basophils Absolute: 0 10*3/uL (ref 0.0–0.2)
Basos: 1 %
EOS (ABSOLUTE): 0.1 10*3/uL (ref 0.0–0.4)
Eos: 2 %
Hematocrit: 41.6 % (ref 34.0–46.6)
Hemoglobin: 14.1 g/dL (ref 11.1–15.9)
Immature Grans (Abs): 0 10*3/uL (ref 0.0–0.1)
Immature Granulocytes: 0 %
Lymphocytes Absolute: 1.9 10*3/uL (ref 0.7–3.1)
Lymphs: 32 %
MCH: 30 pg (ref 26.6–33.0)
MCHC: 33.9 g/dL (ref 31.5–35.7)
MCV: 89 fL (ref 79–97)
Monocytes Absolute: 0.5 10*3/uL (ref 0.1–0.9)
Monocytes: 8 %
Neutrophils Absolute: 3.4 10*3/uL (ref 1.4–7.0)
Neutrophils: 57 %
Platelets: 260 10*3/uL (ref 150–450)
RBC: 4.7 x10E6/uL (ref 3.77–5.28)
RDW: 12.3 % (ref 11.7–15.4)
WBC: 5.9 10*3/uL (ref 3.4–10.8)

## 2019-05-01 LAB — CMP14+EGFR
ALT: 13 IU/L (ref 0–32)
AST: 16 IU/L (ref 0–40)
Albumin/Globulin Ratio: 1.9 (ref 1.2–2.2)
Albumin: 4.1 g/dL (ref 3.8–4.8)
Alkaline Phosphatase: 84 IU/L (ref 39–117)
BUN/Creatinine Ratio: 25 (ref 12–28)
BUN: 18 mg/dL (ref 8–27)
Bilirubin Total: <0.2 mg/dL (ref 0.0–1.2)
CO2: 25 mmol/L (ref 20–29)
Calcium: 9.1 mg/dL (ref 8.7–10.3)
Chloride: 104 mmol/L (ref 96–106)
Creatinine, Ser: 0.72 mg/dL (ref 0.57–1.00)
GFR calc Af Amer: 100 mL/min/{1.73_m2} (ref >59)
GFR calc non Af Amer: 87 mL/min/{1.73_m2} (ref >59)
Globulin, Total: 2.2 g/dL (ref 1.5–4.5)
Glucose: 75 mg/dL (ref 65–99)
Potassium: 4.2 mmol/L (ref 3.5–5.2)
Sodium: 139 mmol/L (ref 134–144)
Total Protein: 6.3 g/dL (ref 6.0–8.5)

## 2019-05-01 NOTE — Progress Notes (Signed)
BP 113/64   Pulse 76   Temp 98 F (36.7 C) (Temporal)   Ht '5\' 5"'$  (1.651 m)   Wt 131 lb (59.4 kg)   SpO2 95%   BMI 21.80 kg/m    Subjective:    Patient ID: Carol Hoover, female    DOB: 1950-12-21, 69 y.o.   MRN: [id number]  HPI 1. Preoperative clearance  HPI: Carol Hoover is a 69 y.o. female presenting on 04/30/2019 for Annual Exam This patient comes in for her annual exam and surgical clearance.  She overall is doing very well.  She is anticipating surgery and left there for future.  She will let us know if there are any problems.   History reviewed. No pertinent past medical history. Relevant past medical, surgical, family and social history reviewed and updated as indicated. Interim medical history since our last visit reviewed. Allergies and medications reviewed and updated. DATA REVIEWED: CHART IN EPIC  Family History reviewed for pertinent findings.  Review of Systems  Constitutional: Negative.  Negative for activity change, fatigue and fever.  HENT: Negative.   Eyes: Negative.   Respiratory: Negative.  Negative for cough.   Cardiovascular: Negative.  Negative for chest pain.  Gastrointestinal: Negative.  Negative for abdominal pain.  Endocrine: Negative.   Genitourinary: Negative.  Negative for dysuria.  Musculoskeletal: Negative.   Skin: Negative.   Neurological: Negative.     Allergies as of 04/30/2019      Reactions   Codeine Nausea Only      Medication List       Accurate as of April 30, 2019 11:59 PM. If you have any questions, ask your nurse or doctor.        STOP taking these medications   amoxicillin-clavulanate 875-125 MG tablet Commonly known as: Augmentin Stopped by: Terald Sleeper, PA-C   Eliquis 5 MG Tabs tablet Generic drug: apixaban Stopped by: Terald Sleeper, PA-C   predniSONE 10 MG (21) Tbpk tablet Commonly known as: STERAPRED UNI-PAK 21 TAB Stopped by: Terald Sleeper, PA-C     TAKE these medications   pantoprazole 40 MG  tablet Commonly known as: PROTONIX Take 1 tablet (40 mg total) by mouth daily.   Restasis Multidose 0.05 % ophthalmic emulsion Generic drug: cycloSPORINE   traMADol 50 MG tablet Commonly known as: ULTRAM Take 1 tablet (50 mg total) by mouth every 8 (eight) hours as needed.   valACYclovir 1000 MG tablet Commonly known as: VALTREX TAKE 1 TABLET BY MOUTH TWICE DAILY FOR  PROVENTION          Objective:    BP 113/64   Pulse 76   Temp 98 F (36.7 C) (Temporal)   Ht '5\' 5"'$  (1.651 m)   Wt 131 lb (59.4 kg)   SpO2 95%   BMI 21.80 kg/m   Allergies  Allergen Reactions  . Codeine Nausea Only    Wt Readings from Last 3 Encounters:  04/30/19 131 lb (59.4 kg)  07/20/18 128 lb (58.1 kg)  07/18/18 130 lb (59 kg)    Physical Exam Constitutional:      Appearance: She is well-developed.  HENT:     Head: Normocephalic and atraumatic.     Right Ear: Tympanic membrane, ear canal and external ear normal.     Left Ear: Tympanic membrane, ear canal and external ear normal.     Nose: Nose normal. No rhinorrhea.     Mouth/Throat:     Pharynx: No oropharyngeal exudate or posterior  oropharyngeal erythema.  Eyes:     Conjunctiva/sclera: Conjunctivae normal.     Pupils: Pupils are equal, round, and reactive to light.  Cardiovascular:     Rate and Rhythm: Normal rate and regular rhythm.     Heart sounds: Normal heart sounds.  Pulmonary:     Effort: Pulmonary effort is normal.     Breath sounds: Normal breath sounds.  Abdominal:     General: Bowel sounds are normal.     Palpations: Abdomen is soft.  Musculoskeletal:     Cervical back: Normal range of motion and neck supple.  Skin:    General: Skin is warm and dry.     Findings: No rash.  Neurological:     Mental Status: She is alert and oriented to person, place, and time.     Deep Tendon Reflexes: Reflexes are normal and symmetric.  Psychiatric:        Behavior: Behavior normal.        Thought Content: Thought content normal.          Judgment: Judgment normal.     Results for orders placed or performed in visit on 04/30/19  CBC with Differential/Platelet  Result Value Ref Range   WBC 5.9 3.4 - 10.8 x10E3/uL   RBC 4.70 3.77 - 5.28 x10E6/uL   Hemoglobin 14.1 11.1 - 15.9 g/dL   Hematocrit 41.6 34.0 - 46.6 %   MCV 89 79 - 97 fL   MCH 30.0 26.6 - 33.0 pg   MCHC 33.9 31.5 - 35.7 g/dL   RDW 12.3 11.7 - 15.4 %   Platelets 260 150 - 450 x10E3/uL   Neutrophils 57 Not Estab. %   Lymphs 32 Not Estab. %   Monocytes 8 Not Estab. %   Eos 2 Not Estab. %   Basos 1 Not Estab. %   Neutrophils Absolute 3.4 1.4 - 7.0 x10E3/uL   Lymphocytes Absolute 1.9 0.7 - 3.1 x10E3/uL   Monocytes Absolute 0.5 0.1 - 0.9 x10E3/uL   EOS (ABSOLUTE) 0.1 0.0 - 0.4 x10E3/uL   Basophils Absolute 0.0 0.0 - 0.2 x10E3/uL   Immature Granulocytes 0 Not Estab. %   Immature Grans (Abs) 0.0 0.0 - 0.1 x10E3/uL  CMP14+EGFR  Result Value Ref Range   Glucose 75 65 - 99 mg/dL   BUN 18 8 - 27 mg/dL   Creatinine, Ser 0.72 0.57 - 1.00 mg/dL   GFR calc non Af Amer 87 >59 mL/min/1.73   GFR calc Af Amer 100 >59 mL/min/1.73   BUN/Creatinine Ratio 25 12 - 28   Sodium 139 134 - 144 mmol/L   Potassium 4.2 3.5 - 5.2 mmol/L   Chloride 104 96 - 106 mmol/L   CO2 25 20 - 29 mmol/L   Calcium 9.1 8.7 - 10.3 mg/dL   Total Protein 6.3 6.0 - 8.5 g/dL   Albumin 4.1 3.8 - 4.8 g/dL   Globulin, Total 2.2 1.5 - 4.5 g/dL   Albumin/Globulin Ratio 1.9 1.2 - 2.2   Bilirubin Total <0.2 0.0 - 1.2 mg/dL   Alkaline Phosphatase 84 39 - 117 IU/L   AST 16 0 - 40 IU/L   ALT 13 0 - 32 IU/L  Urinalysis  Result Value Ref Range   Specific Gravity, UA >1.030 (H) 1.005 - 1.030   pH, UA 5.5 5.0 - 7.5   Color, UA Yellow Yellow   Appearance Ur Clear Clear   Leukocytes,UA 1+ (A) Negative   Protein,UA Negative Negative/Trace   Glucose, UA Negative Negative  Ketones, UA Trace (A) Negative   RBC, UA Negative Negative   Bilirubin, UA Negative Negative   Urobilinogen, Ur 0.2 0.2 - 1.0  mg/dL   Nitrite, UA Negative Negative  Bayer DCA Hb A1c Waived  Result Value Ref Range   HB A1C (BAYER DCA - WAIVED) 5.1 <7.0 %  CoaguChek XS/INR Waived  Result Value Ref Range   INR 1.0 0.9 - 1.1   Prothrombin Time 11.6 sec      Assessment & Plan:   1. Preoperative clearance - EKG 12-Lead - DG Chest 2 View; Future - CBC with Differential/Platelet - CMP14+EGFR - Urinalysis - Bayer DCA Hb A1c Waived - CoaguChek XS/INR Waived   Continue all other maintenance medications as listed above.  Follow up plan: No follow-ups on file.  Educational handout given for [place] PA-C [place] [address]  [place], [place] [address] [phone]   05/01/2019, 6:11 PM

## 2019-05-21 DIAGNOSIS — M1712 Unilateral primary osteoarthritis, left knee: Secondary | ICD-10-CM | POA: Diagnosis not present

## 2019-05-21 DIAGNOSIS — G8918 Other acute postprocedural pain: Secondary | ICD-10-CM | POA: Diagnosis not present

## 2019-05-23 ENCOUNTER — Ambulatory Visit: Payer: Medicare HMO | Attending: Orthopedic Surgery | Admitting: Physical Therapy

## 2019-05-23 ENCOUNTER — Other Ambulatory Visit: Payer: Self-pay

## 2019-05-23 DIAGNOSIS — M6281 Muscle weakness (generalized): Secondary | ICD-10-CM | POA: Insufficient documentation

## 2019-05-23 DIAGNOSIS — M25562 Pain in left knee: Secondary | ICD-10-CM | POA: Diagnosis not present

## 2019-05-23 DIAGNOSIS — G8929 Other chronic pain: Secondary | ICD-10-CM | POA: Insufficient documentation

## 2019-05-23 DIAGNOSIS — R600 Localized edema: Secondary | ICD-10-CM | POA: Diagnosis not present

## 2019-05-23 DIAGNOSIS — M25662 Stiffness of left knee, not elsewhere classified: Secondary | ICD-10-CM | POA: Insufficient documentation

## 2019-05-23 NOTE — Therapy (Addendum)
[facility] [place], [place], [address] Phone: [phone]   Fax:  [phone]  Physical Therapy Evaluation  Patient Details  Name: Carol Hoover MRN: [id number] Date of Birth: 1950-11-10 Referring Provider (PT): Gaynelle Arabian MD   Encounter Date: 05/23/2019  PT End of Session - 05/23/19 1342    Visit Number  1    Number of Visits  16    Date for PT Re-Evaluation  07/18/19    Authorization Type  FOTO.    PT Start Time  0101    PT Stop Time  0156    PT Time Calculation (min)  55 min    Equipment Utilized During Treatment  --   FWW   Activity Tolerance  Patient tolerated treatment well    Behavior During Therapy  [facility] for tasks assessed/performed       No past medical history on file.  Past Surgical History:  Procedure Laterality Date  . TUBAL LIGATION      There were no vitals filed for this visit.   Subjective Assessment - 05/23/19 1338    Subjective  COVID-19 screen performed prior to patient entering clinic.  The patient underwent a left total knee replacement on 05/21/19.  She presents to the clinic today with a FWW.  She is currently limiting her weight bearing but knows she is allowed full weight bearing.  She has an HEP and states she is compliant to it.  Her pain today is severe rated a t a 10/10.    Pertinent History  Previous left knee scope,    How long can you walk comfortably?  Around house with [place].    Patient Stated Goals  Hike again.    Currently in Pain?  Yes    Pain Score  10-Worst pain ever    Pain Location  Knee    Pain Orientation  Left    Pain Descriptors / Indicators  Aching;Throbbing;Sore    Pain Type  Surgical pain    Pain Onset  In the past 7 days    Pain Frequency  Constant    Aggravating Factors   Sitting too long.    Pain Relieving Factors  Walking and stretching it out.         [facility] PT Assessment - 05/23/19 0001      Assessment   Medical Diagnosis  Left total knee  replacement.    Referring Provider (PT)  Gaynelle Arabian MD    Onset Date/Surgical Date  --   05/21/19     Precautions   Precautions  --   No ultrasound.     Restrictions   Weight Bearing Restrictions  No      Balance Screen   Has the patient fallen in the past 6 months  No    Has the patient had a decrease in activity level because of a fear of falling?   No    Is the patient reluctant to leave their home because of a fear of falling?   No      Home Environment   Living Environment  Private residence      Prior Function   Level of Independence  Independent      Observation/Other Assessments   Observations  Ace wrap and post-surgical dressing which can be removed today.  Aqualcel intact over her left knee.    Focus on Therapeutic Outcomes (FOTO)   78% limitation.      Observation/Other Assessments-Edema    Edema  Circumferential      Circumferential Edema   Circumferential - Left   Left 4 cms > right.      ROM / Strength   AROM / PROM / Strength  AROM;Strength      AROM   Overall AROM Comments  In supine left knee extension to -15 degrees.  Her seated flexion= 45 degrees but after 5 minutes on a stepper she could achieve 70 degrees.      Strength   Overall Strength Comments  Left hip flexion 2+/5.  Significant lack of volitional contraction of her left quadrceps.      Palpation   Palpation comment  C/o diffuse left knee pain currently.      Ambulation/Gait   Gait Comments  Patient walking with FWW and currently limited weight bearing over her left LE.  Encouraged her to begin bearing more weight over her left LE.                Objective measurements completed on examination: See above findings.      [place] Adult PT Treatment/Exercise - 05/23/19 0001      Exercises   Exercises  Knee/Hip      Knee/Hip Exercises: Aerobic   Nustep  Level 1 x 10 minutes moving forward x 1 to increase knee flexion.      Modalities   Modalities  Vasopneumatic       Vasopneumatic   Number Minutes Vasopneumatic   15 minutes    Vasopnuematic Location   --   Left knee.   Vasopneumatic Pressure  Low               PT Short Term Goals - 05/23/19 1414      PT SHORT TERM GOAL #1   Title  STG's=LTG's.        PT Long Term Goals - 05/23/19 1415      PT LONG TERM GOAL #1   Title  Independent with a HEP.    Time  8    Period  Weeks    Status  New      PT LONG TERM GOAL #2   Title  Active left knee flexion to 115 degrees+ so the patient can perform functional tasks and do so with pain not > 2-3/10    Time  8    Period  Weeks    Status  New      PT LONG TERM GOAL #3   Title  Full active knee extension in order to normalize gait.    Time  8    Period  Weeks    Status  New      PT LONG TERM GOAL #4   Title  Increase left knee strength to a solid 4+/5 to provide good stability for accomplishment of functional activities.    Time  8    Period  Weeks    Status  New      PT LONG TERM GOAL #5   Title  Perform a reciprocating stair gait with one railing with pain not > 2-3/10.    Time  8    Period  Weeks    Status  New      PT LONG TERM GOAL #6   Title  Walk 500 feet in clinic without deviation and no assisitve device.    Time  8    Period  Weeks    Status  New  Plan - 05/23/19 1405    Clinical Impression Statement  The patient presents to OPPT s/p left total knee replacement performed on 05/21/19.  She presented to the clinic today wiht a FWW and limiting weight bearing over her left LE.  Her left knee range of motion is -15 degrees with flexion to 70 degrees after performing a stepper exercise for a few minutes.  She has an expected amount of edema.  She has a loss of volitional left quadriceps contraction currently.  Her FOTO limitation score is a 78%.  Patient will benefit from skilled physical therapy intervention to address deficits and pain.  Patient did very well today with a decrease in her pain-level after  treatment.    Personal Factors and Comorbidities  Comorbidity 1    Comorbidities  Previous left knee scope,    Examination-Activity Limitations  Other;Locomotion Level;Stairs;Sit    Examination-Participation Restrictions  Other    Stability/Clinical Decision Making  Stable/Uncomplicated    Clinical Decision Making  Low    Rehab Potential  Excellent    PT Frequency  --   2-3 times a week, 12-16 visits.   PT Treatment/Interventions  ADLs/Self Care Home Management;Cryotherapy;Electrical Stimulation;Moist Heat;Gait training;Stair training;Functional mobility training;Therapeutic activities;Therapeutic exercise;Neuromuscular re-education;Manual techniques;Patient/family education;Passive range of motion;Vasopneumatic Device    PT Next Visit Plan  Nustep with progression to bike, PROM.  Achieve full extension early. VMS to left quads.  Vasopneumatic beginning on low.    Consulted and Agree with Plan of Care  Patient       Patient will benefit from skilled therapeutic intervention in order to improve the following deficits and impairments:  Abnormal gait, Difficulty walking, Pain, Increased edema, Decreased strength, Decreased activity tolerance, Decreased range of motion  Visit Diagnosis: Chronic pain of left knee - Plan: PT plan of care cert/re-cert  Stiffness of left knee, not elsewhere classified - Plan: PT plan of care cert/re-cert  Localized edema - Plan: PT plan of care cert/re-cert  Muscle weakness (generalized) - Plan: PT plan of care cert/re-cert     Problem List Patient Active Problem List   Diagnosis Date Noted  . Osteoarthritis of knee 12/19/2018  . History of deep vein thrombosis 12/19/2018  . Acute deep vein thrombosis (DVT) of popliteal vein of left lower extremity ([place]) 04/25/2018  . Acute deep vein thrombosis (DVT) of left peroneal vein ([place]) 04/25/2018  . Well female exam with routine gynecological exam 03/22/2016    Milburn Freeney, [place] MPT 05/23/2019, 2:27 PM  [facility] [address] [place], [place], [address] Phone: [phone]   Fax:  [phone]  Name: Carol Hoover MRN: [id number] Date of Birth: 01-07-1951

## 2019-05-27 ENCOUNTER — Ambulatory Visit: Payer: Medicare HMO | Admitting: Physical Therapy

## 2019-05-27 ENCOUNTER — Other Ambulatory Visit: Payer: Self-pay

## 2019-05-27 DIAGNOSIS — R600 Localized edema: Secondary | ICD-10-CM

## 2019-05-27 DIAGNOSIS — M25662 Stiffness of left knee, not elsewhere classified: Secondary | ICD-10-CM | POA: Diagnosis not present

## 2019-05-27 DIAGNOSIS — G8929 Other chronic pain: Secondary | ICD-10-CM | POA: Diagnosis not present

## 2019-05-27 DIAGNOSIS — M6281 Muscle weakness (generalized): Secondary | ICD-10-CM

## 2019-05-27 DIAGNOSIS — M25562 Pain in left knee: Secondary | ICD-10-CM

## 2019-05-27 NOTE — Therapy (Signed)
[facility] [place], [place], [address] Phone: [phone]   Fax:  [phone]  Physical Therapy Treatment  Patient Details  Name: Carol Hoover MRN: [id number] Date of Birth: 11/20/1950 Referring Provider (PT): Gaynelle Arabian MD   Encounter Date: 05/27/2019  PT End of Session - 05/27/19 1420    Visit Number  2    Number of Visits  16    Date for PT Re-Evaluation  07/18/19    Authorization Type  FOTO.    PT Start Time  0100    PT Stop Time  0150    PT Time Calculation (min)  50 min    Activity Tolerance  Patient tolerated treatment well    Behavior During Therapy  [facility] for tasks assessed/performed       No past medical history on file.  Past Surgical History:  Procedure Laterality Date  . TUBAL LIGATION      There were no vitals filed for this visit.  Subjective Assessment - 05/27/19 1307    Subjective  COVID-19 screen performed prior to patient entering clinic.  Off my walker.    Pertinent History  Previous left knee scope,    How long can you walk comfortably?  Around house with [place].    Patient Stated Goals  Hike again.    Currently in Pain?  Yes    Pain Score  8     Pain Location  Knee    Pain Orientation  Left    Pain Descriptors / Indicators  Aching;Throbbing;Sore    Pain Type  Surgical pain    Pain Onset  In the past 7 days         [facility] PT Assessment - 05/27/19 0001      AROM   Overall AROM Comments  In supine left knee flexion to 100 degrees.                   [place] Adult PT Treatment/Exercise - 05/27/19 0001      Exercises   Exercises  Knee/Hip      Knee/Hip Exercises: Aerobic   Nustep  Level 1 x 15 minutes moving forward x 2 to increase flexion.      Modalities   Modalities  Vasopneumatic      Vasopneumatic   Number Minutes Vasopneumatic   15 minutes    Vasopnuematic Location   --   Left knee.   Vasopneumatic Pressure  Low      Manual Therapy   Manual Therapy   Passive ROM    Passive ROM  In supine:  Left knee passive range of motion into flexion and extension x 14 minutes.               PT Short Term Goals - 05/23/19 1414      PT SHORT TERM GOAL #1   Title  STG's=LTG's.        PT Long Term Goals - 05/23/19 1415      PT LONG TERM GOAL #1   Title  Independent with a HEP.    Time  8    Period  Weeks    Status  New      PT LONG TERM GOAL #2   Title  Active left knee flexion to 115 degrees+ so the patient can perform functional tasks and do so with pain not > 2-3/10    Time  8    Period  Weeks    Status  New  PT LONG TERM GOAL #3   Title  Full active knee extension in order to normalize gait.    Time  8    Period  Weeks    Status  New      PT LONG TERM GOAL #4   Title  Increase left knee strength to a solid 4+/5 to provide good stability for accomplishment of functional activities.    Time  8    Period  Weeks    Status  New      PT LONG TERM GOAL #5   Title  Perform a reciprocating stair gait with one railing with pain not > 2-3/10.    Time  8    Period  Weeks    Status  New      PT LONG TERM GOAL #6   Title  Walk 500 feet in clinic without deviation and no assisitve device.    Time  8    Period  Weeks    Status  New            Plan - 05/27/19 1357    Clinical Impression Statement  Patient doing great with passive left knee flexion to 100 degrees.    Personal Factors and Comorbidities  Comorbidity 1    Comorbidities  Previous left knee scope,    Examination-Activity Limitations  Other;Locomotion Level;Stairs;Sit    Examination-Participation Restrictions  Other    PT Treatment/Interventions  ADLs/Self Care Home Management;Cryotherapy;Electrical Stimulation;Moist Heat;Gait training;Stair training;Functional mobility training;Therapeutic activities;Therapeutic exercise;Neuromuscular re-education;Manual techniques;Patient/family education;Passive range of motion;Vasopneumatic Device    PT Next Visit Plan   Nustep with progression to bike, PROM.  Achieve full extension early. VMS to left quads.  Vasopneumatic beginning on low.       Patient will benefit from skilled therapeutic intervention in order to improve the following deficits and impairments:     Visit Diagnosis: Chronic pain of left knee  Stiffness of left knee, not elsewhere classified  Localized edema  Muscle weakness (generalized)     Problem List Patient Active Problem List   Diagnosis Date Noted  . Osteoarthritis of knee 12/19/2018  . History of deep vein thrombosis 12/19/2018  . Acute deep vein thrombosis (DVT) of popliteal vein of left lower extremity ([place]) 04/25/2018  . Acute deep vein thrombosis (DVT) of left peroneal vein ([place]) 04/25/2018  . Well female exam with routine gynecological exam 03/22/2016    Ramzi Brathwaite, [place] MPT 05/27/2019, 2:22 PM  [facility] [address] [place], [place], [address] Phone: [phone]   Fax:  [phone]  Name: Carol Hoover MRN: [id number] Date of Birth: Oct 23, 1950

## 2019-05-29 ENCOUNTER — Other Ambulatory Visit: Payer: Self-pay

## 2019-05-29 ENCOUNTER — Ambulatory Visit: Payer: Medicare HMO | Admitting: Physical Therapy

## 2019-05-29 DIAGNOSIS — G8929 Other chronic pain: Secondary | ICD-10-CM | POA: Diagnosis not present

## 2019-05-29 DIAGNOSIS — M6281 Muscle weakness (generalized): Secondary | ICD-10-CM

## 2019-05-29 DIAGNOSIS — M25562 Pain in left knee: Secondary | ICD-10-CM | POA: Diagnosis not present

## 2019-05-29 DIAGNOSIS — M25662 Stiffness of left knee, not elsewhere classified: Secondary | ICD-10-CM | POA: Diagnosis not present

## 2019-05-29 DIAGNOSIS — R600 Localized edema: Secondary | ICD-10-CM

## 2019-05-29 NOTE — Therapy (Signed)
[facility] [place], [place], [address] Phone: [phone]   Fax:  [phone]  Physical Therapy Treatment  Patient Details  Name: AARIYANA CAGE MRN: [id number] Date of Birth: [birth year] Referring Provider (PT): Gaynelle Arabian MD   Encounter Date: 05/29/2019  PT End of Session - 05/29/19 1424    Visit Number  3    Number of Visits  16    Date for PT Re-Evaluation  07/18/19    Authorization Type  FOTO.    PT Start Time  0100    PT Stop Time  0152    PT Time Calculation (min)  52 min       No past medical history on file.  Past Surgical History:  Procedure Laterality Date  . TUBAL LIGATION      There were no vitals filed for this visit.  Subjective Assessment - 05/29/19 1404    Subjective  COVID-19 screen performed prior to patient entering clinic.  I was real sore after last treatment.    Pertinent History  Previous left knee scope,    How long can you walk comfortably?  Around house with [place].    Patient Stated Goals  Hike again.    Currently in Pain?  Yes    Pain Score  5     Pain Location  Knee    Pain Orientation  Left    Pain Descriptors / Indicators  Aching;Throbbing;Sore    Pain Type  Surgical pain    Pain Onset  In the past 7 days         [facility] PT Assessment - 05/29/19 0001      AROM   Overall AROM Comments  In supine, left knee flexion to 105 degrees.                   [facility] Adult PT Treatment/Exercise - 05/29/19 0001      Exercises   Exercises  Knee/Hip      Knee/Hip Exercises: Aerobic   Nustep  Level 1 x 15 minutes moving seat forward as tolerated to increase knee flexion.      Knee/Hip Exercises: Supine   Quad Sets Limitations  SAQ's x 15 minutes facilitated with [place] e'stim with 10 sec extension holds and 10 sec rest.      Modalities   Modalities  Vasopneumatic      Vasopneumatic   Number Minutes Vasopneumatic   15 minutes    Vasopnuematic Location   --   Left  knee.   Vasopneumatic Pressure  Medium               PT Short Term Goals - 05/23/19 1414      PT SHORT TERM GOAL #1   Title  STG's=LTG's.        PT Long Term Goals - 05/23/19 1415      PT LONG TERM GOAL #1   Title  Independent with a HEP.    Time  8    Period  Weeks    Status  New      PT LONG TERM GOAL #2   Title  Active left knee flexion to 115 degrees+ so the patient can perform functional tasks and do so with pain not > 2-3/10    Time  8    Period  Weeks    Status  New      PT LONG TERM GOAL #3   Title  Full active knee extension in order  to normalize gait.    Time  8    Period  Weeks    Status  New      PT LONG TERM GOAL #4   Title  Increase left knee strength to a solid 4+/5 to provide good stability for accomplishment of functional activities.    Time  8    Period  Weeks    Status  New      PT LONG TERM GOAL #5   Title  Perform a reciprocating stair gait with one railing with pain not > 2-3/10.    Time  8    Period  Weeks    Status  New      PT LONG TERM GOAL #6   Title  Walk 500 feet in clinic without deviation and no assisitve device.    Time  8    Period  Weeks    Status  New            Plan - 05/29/19 1425    Clinical Impression Statement  Patient a great job today.  Very good quad activation with [place] e'stim today.  Added prone hang to HEP today.  She achieved left knee flexion in supine to 105 degrees today.    Personal Factors and Comorbidities  Comorbidity 1    Comorbidities  Previous left knee scope,    Examination-Activity Limitations  Other;Locomotion Level;Stairs;Sit    Examination-Participation Restrictions  Other    Stability/Clinical Decision Making  Stable/Uncomplicated    Rehab Potential  Excellent    PT Treatment/Interventions  ADLs/Self Care Home Management;Cryotherapy;Electrical Stimulation;Moist Heat;Gait training;Stair training;Functional mobility training;Therapeutic activities;Therapeutic  exercise;Neuromuscular re-education;Manual techniques;Patient/family education;Passive range of motion;Vasopneumatic Device    PT Next Visit Plan  Nustep with progression to bike, PROM.  Achieve full extension early. VMS to left quads.  Vasopneumatic beginning on low.    Consulted and Agree with Plan of Care  Patient       Patient will benefit from skilled therapeutic intervention in order to improve the following deficits and impairments:  Abnormal gait, Difficulty walking, Pain, Increased edema, Decreased strength, Decreased activity tolerance, Decreased range of motion  Visit Diagnosis: Chronic pain of left knee  Stiffness of left knee, not elsewhere classified  Localized edema  Muscle weakness (generalized)     Problem List Patient Active Problem List   Diagnosis Date Noted  . Osteoarthritis of knee 12/19/2018  . History of deep vein thrombosis 12/19/2018  . Acute deep vein thrombosis (DVT) of popliteal vein of left lower extremity ([place]) 04/25/2018  . Acute deep vein thrombosis (DVT) of left peroneal vein ([place]) 04/25/2018  . Well female exam with routine gynecological exam 03/22/2016    Toure Edmonds, [place] MPT 05/29/2019, 2:28 PM  [facility] [place], [place], [address] Phone: [phone]   Fax:  [phone]  Name: MEILANI ROETHLISBERGER MRN: [id number] Date of Birth: 12-Dec-1950

## 2019-05-31 ENCOUNTER — Ambulatory Visit: Payer: Medicare HMO | Admitting: *Deleted

## 2019-05-31 ENCOUNTER — Other Ambulatory Visit: Payer: Self-pay

## 2019-05-31 DIAGNOSIS — G8929 Other chronic pain: Secondary | ICD-10-CM

## 2019-05-31 DIAGNOSIS — M25562 Pain in left knee: Secondary | ICD-10-CM | POA: Diagnosis not present

## 2019-05-31 DIAGNOSIS — M6281 Muscle weakness (generalized): Secondary | ICD-10-CM

## 2019-05-31 DIAGNOSIS — M25662 Stiffness of left knee, not elsewhere classified: Secondary | ICD-10-CM | POA: Diagnosis not present

## 2019-05-31 DIAGNOSIS — R600 Localized edema: Secondary | ICD-10-CM

## 2019-05-31 NOTE — Therapy (Signed)
[facility] [place], [place], [address] Phone: [phone]   Fax:  [phone]  Physical Therapy Treatment  Patient Details  Name: Carol Hoover MRN: [id number] Date of Birth: 1950/10/09 Referring Provider (PT): Gaynelle Arabian MD   Encounter Date: 05/31/2019  PT End of Session - 05/31/19 1203    Visit Number  4    Number of Visits  16    Date for PT Re-Evaluation  07/18/19    Authorization Type  FOTO.    PT Start Time  1115    PT Stop Time  1213    PT Time Calculation (min)  58 min       No past medical history on file.  Past Surgical History:  Procedure Laterality Date  . TUBAL LIGATION      There were no vitals filed for this visit.  Subjective Assessment - 05/31/19 1120    Subjective  COVID-19 screen performed prior to patient entering clinic.  I was real sore after last treatment.    Pertinent History  Previous left knee scope,    Patient Stated Goals  Hike again.    Pain Score  5     Pain Orientation  Left    Pain Type  Surgical pain                       [facility] Adult PT Treatment/Exercise - 05/31/19 0001      Exercises   Exercises  Knee/Hip      Knee/Hip Exercises: Aerobic   Recumbent Bike  seat 6 x 15 min    Nustep  --      Knee/Hip Exercises: Standing   Forward Lunges  Left;1 set;10 reps   hold 10 secs for knee flexion stretches   Rocker Board  3 minutes      Knee/Hip Exercises: Supine   Short Arc Quad Sets  AROM;Left   x 10 mins withRussian Estim 10 secs on/off     Modalities   Modalities  Vasopneumatic      Vasopneumatic   Number Minutes Vasopneumatic   15 minutes    Vasopnuematic Location   --   Left knee.   Vasopneumatic Pressure  Low    Vasopneumatic Temperature   36 for edema      Manual Therapy   Manual Therapy  Passive ROM    Passive ROM  In supine:  Left knee passive range of motion into extension               PT Short Term Goals - 05/23/19  1414      PT SHORT TERM GOAL #1   Title  STG's=LTG's.        PT Long Term Goals - 05/23/19 1415      PT LONG TERM GOAL #1   Title  Independent with a HEP.    Time  8    Period  Weeks    Status  New      PT LONG TERM GOAL #2   Title  Active left knee flexion to 115 degrees+ so the patient can perform functional tasks and do so with pain not > 2-3/10    Time  8    Period  Weeks    Status  New      PT LONG TERM GOAL #3   Title  Full active knee extension in order to normalize gait.    Time  8    Period  Weeks    Status  New      PT LONG TERM GOAL #4   Title  Increase left knee strength to a solid 4+/5 to provide good stability for accomplishment of functional activities.    Time  8    Period  Weeks    Status  New      PT LONG TERM GOAL #5   Title  Perform a reciprocating stair gait with one railing with pain not > 2-3/10.    Time  8    Period  Weeks    Status  New      PT LONG TERM GOAL #6   Title  Walk 500 feet in clinic without deviation and no assisitve device.    Time  8    Period  Weeks    Status  New            Plan - 05/31/19 1204    Clinical Impression Statement  Pt arrived today doing fairly well. She was able to progress to the bike today for flexion ROM progression and did well. She was guided through box lunges as well as rockerboard stretches f/b russian VMS for quad facillitation. Normal Vaso response    Examination-Activity Limitations  Other;Locomotion Level;Stairs;Sit    Examination-Participation Restrictions  Other    Stability/Clinical Decision Making  Stable/Uncomplicated    Rehab Potential  Excellent    PT Treatment/Interventions  ADLs/Self Care Home Management;Cryotherapy;Electrical Stimulation;Moist Heat;Gait training;Stair training;Functional mobility training;Therapeutic activities;Therapeutic exercise;Neuromuscular re-education;Manual techniques;Patient/family education;Passive range of motion;Vasopneumatic Device    PT Next Visit  Plan  Nustep with progression to bike, PROM.  Achieve full extension early. VMS to left quads.  Vasopneumatic beginning on low.    Consulted and Agree with Plan of Care  Patient       Patient will benefit from skilled therapeutic intervention in order to improve the following deficits and impairments:  Abnormal gait, Difficulty walking, Pain, Increased edema, Decreased strength, Decreased activity tolerance, Decreased range of motion  Visit Diagnosis: Chronic pain of left knee  Stiffness of left knee, not elsewhere classified  Localized edema  Muscle weakness (generalized)     Problem List Patient Active Problem List   Diagnosis Date Noted  . Osteoarthritis of knee 12/19/2018  . History of deep vein thrombosis 12/19/2018  . Acute deep vein thrombosis (DVT) of popliteal vein of left lower extremity ([place]) 04/25/2018  . Acute deep vein thrombosis (DVT) of left peroneal vein ([place]) 04/25/2018  . Well female exam with routine gynecological exam 03/22/2016    [place],CHRIS, PTA 05/31/2019, 12:30 PM  [facility] [place], [place], [address] Phone: [phone]   Fax:  [phone]  Name: Carol Hoover MRN: [id number] Date of Birth: 08-12-1950

## 2019-06-03 ENCOUNTER — Other Ambulatory Visit: Payer: Self-pay

## 2019-06-03 ENCOUNTER — Encounter: Payer: Self-pay | Admitting: Physical Therapy

## 2019-06-03 ENCOUNTER — Ambulatory Visit: Payer: Medicare HMO | Admitting: Physical Therapy

## 2019-06-03 DIAGNOSIS — M25562 Pain in left knee: Secondary | ICD-10-CM | POA: Diagnosis not present

## 2019-06-03 DIAGNOSIS — R600 Localized edema: Secondary | ICD-10-CM | POA: Diagnosis not present

## 2019-06-03 DIAGNOSIS — M6281 Muscle weakness (generalized): Secondary | ICD-10-CM | POA: Diagnosis not present

## 2019-06-03 DIAGNOSIS — G8929 Other chronic pain: Secondary | ICD-10-CM | POA: Diagnosis not present

## 2019-06-03 DIAGNOSIS — R232 Flushing: Secondary | ICD-10-CM | POA: Diagnosis not present

## 2019-06-03 DIAGNOSIS — N898 Other specified noninflammatory disorders of vagina: Secondary | ICD-10-CM | POA: Diagnosis not present

## 2019-06-03 DIAGNOSIS — M25662 Stiffness of left knee, not elsewhere classified: Secondary | ICD-10-CM

## 2019-06-03 DIAGNOSIS — N951 Menopausal and female climacteric states: Secondary | ICD-10-CM | POA: Diagnosis not present

## 2019-06-03 NOTE — Therapy (Signed)
[facility] [place], [place], [address] Phone: [phone]   Fax:  [phone]  Physical Therapy Treatment  Patient Details  Name: Carol Hoover MRN: [id number] Date of Birth: 17-Jan-1951 Referring Provider (PT): Gaynelle Arabian MD   Encounter Date: 06/03/2019  PT End of Session - 06/03/19 1453    Visit Number  5    Number of Visits  16    Date for PT Re-Evaluation  07/18/19    Authorization Type  FOTO.    PT Start Time  1430    PT Stop Time  1526    PT Time Calculation (min)  56 min    Activity Tolerance  Patient tolerated treatment well    Behavior During Therapy  WFL for tasks assessed/performed       History reviewed. No pertinent past medical history.  Past Surgical History:  Procedure Laterality Date  . TUBAL LIGATION      There were no vitals filed for this visit.  Subjective Assessment - 06/03/19 1443    Subjective  COVID-19 screen performed prior to patient entering clinic.  Patient reports feeling pain at 5-7/10. States ongoing posterior knee pain and pain at night which limits her sleeping.    Pertinent History  Previous left knee scope,    How long can you walk comfortably?  Around house with [place].    Patient Stated Goals  Hike again.    Currently in Pain?  Yes    Pain Score  7     Pain Location  Knee    Pain Orientation  Left    Pain Descriptors / Indicators  Aching;Throbbing;Sore    Pain Type  Surgical pain    Pain Onset  In the past 7 days    Pain Frequency  Constant         [facility] PT Assessment - 06/03/19 0001      Assessment   Medical Diagnosis  Left total knee replacement.    Referring Provider (PT)  Gaynelle Arabian MD    Onset Date/Surgical Date  05/21/19    Next MD Visit  06/04/2019      ROM / Strength   AROM / PROM / Strength  PROM      AROM   AROM Assessment Site  Knee    Right/Left Knee  Left    Left Knee Extension  9    Left Knee Flexion  110      PROM   PROM Assessment  Site  Knee    Right/Left Knee  Left    Left Knee Extension  6    Left Knee Flexion  115                   [facility] Adult PT Treatment/Exercise - 06/03/19 0001      Exercises   Exercises  Knee/Hip      Knee/Hip Exercises: Aerobic   Recumbent Bike  seat 6 x 15 min      Knee/Hip Exercises: Supine   Short Arc Quad Sets  Strengthening;Left;Other (comment)    Short Arc Quad Sets Limitations  x10 mins with VMS 10/10, 280 usec, 50 pps x10 mins      Modalities   Modalities  Vasopneumatic      Vasopneumatic   Number Minutes Vasopneumatic   15 minutes    Vasopnuematic Location   Knee    Vasopneumatic Pressure  Low    Vasopneumatic Temperature   36 for edema  Manual Therapy   Manual Therapy  Passive ROM    Passive ROM  Left knee passive range of motion into extension               PT Short Term Goals - 05/23/19 1414      PT SHORT TERM GOAL #1   Title  STG's=LTG's.        PT Long Term Goals - 05/23/19 1415      PT LONG TERM GOAL #1   Title  Independent with a HEP.    Time  8    Period  Weeks    Status  New      PT LONG TERM GOAL #2   Title  Active left knee flexion to 115 degrees+ so the patient can perform functional tasks and do so with pain not > 2-3/10    Time  8    Period  Weeks    Status  New      PT LONG TERM GOAL #3   Title  Full active knee extension in order to normalize gait.    Time  8    Period  Weeks    Status  New      PT LONG TERM GOAL #4   Title  Increase left knee strength to a solid 4+/5 to provide good stability for accomplishment of functional activities.    Time  8    Period  Weeks    Status  New      PT LONG TERM GOAL #5   Title  Perform a reciprocating stair gait with one railing with pain not > 2-3/10.    Time  8    Period  Weeks    Status  New      PT LONG TERM GOAL #6   Title  Walk 500 feet in clinic without deviation and no assisitve device.    Time  8    Period  Weeks    Status  New             Plan - 06/03/19 1531    Clinical Impression Statement  Patient responded well to therapy session with minimal reports of increased pain. Patient noted with good response to VMS but still lacking terminal knee extensions. Goals ongoing at this time.    Personal Factors and Comorbidities  Comorbidity 1    Comorbidities  Previous left knee scope    Examination-Activity Limitations  Other;Locomotion Level;Stairs;Sit    Examination-Participation Restrictions  Other    Stability/Clinical Decision Making  Stable/Uncomplicated    Clinical Decision Making  Low    Rehab Potential  Excellent    PT Treatment/Interventions  ADLs/Self Care Home Management;Cryotherapy;Electrical Stimulation;Moist Heat;Gait training;Stair training;Functional mobility training;Therapeutic activities;Therapeutic exercise;Neuromuscular re-education;Manual techniques;Patient/family education;Passive range of motion;Vasopneumatic Device    PT Next Visit Plan  Nustep with progression to bike, PROM.  Achieve full extension early. VMS to left quads.  Vasopneumatic beginning on low.    Consulted and Agree with Plan of Care  Patient       Patient will benefit from skilled therapeutic intervention in order to improve the following deficits and impairments:  Abnormal gait, Difficulty walking, Pain, Increased edema, Decreased strength, Decreased activity tolerance, Decreased range of motion  Visit Diagnosis: Chronic pain of left knee  Stiffness of left knee, not elsewhere classified  Localized edema  Muscle weakness (generalized)     Problem List Patient Active Problem List   Diagnosis Date Noted  . Osteoarthritis of knee 12/19/2018  .  History of deep vein thrombosis 12/19/2018  . Acute deep vein thrombosis (DVT) of popliteal vein of left lower extremity ([place]) 04/25/2018  . Acute deep vein thrombosis (DVT) of left peroneal vein ([place]) 04/25/2018  . Well female exam with routine gynecological exam 03/22/2016     Carol Hoover, PT, DPT 06/03/2019, 3:49 PM  [facility] [place], [place], [address] Phone: [phone]   Fax:  [phone]  Name: [name] MRN: [id number] Date of Birth: March 30, 1950

## 2019-06-05 ENCOUNTER — Ambulatory Visit: Payer: Medicare HMO | Admitting: Physical Therapy

## 2019-06-05 ENCOUNTER — Other Ambulatory Visit: Payer: Self-pay

## 2019-06-05 ENCOUNTER — Encounter: Payer: Self-pay | Admitting: Physical Therapy

## 2019-06-05 DIAGNOSIS — N951 Menopausal and female climacteric states: Secondary | ICD-10-CM | POA: Diagnosis not present

## 2019-06-05 DIAGNOSIS — M25562 Pain in left knee: Secondary | ICD-10-CM | POA: Diagnosis not present

## 2019-06-05 DIAGNOSIS — M25662 Stiffness of left knee, not elsewhere classified: Secondary | ICD-10-CM | POA: Diagnosis not present

## 2019-06-05 DIAGNOSIS — R69 Illness, unspecified: Secondary | ICD-10-CM | POA: Diagnosis not present

## 2019-06-05 DIAGNOSIS — R600 Localized edema: Secondary | ICD-10-CM | POA: Diagnosis not present

## 2019-06-05 DIAGNOSIS — M6281 Muscle weakness (generalized): Secondary | ICD-10-CM | POA: Diagnosis not present

## 2019-06-05 DIAGNOSIS — G8929 Other chronic pain: Secondary | ICD-10-CM

## 2019-06-05 DIAGNOSIS — R232 Flushing: Secondary | ICD-10-CM | POA: Diagnosis not present

## 2019-06-05 NOTE — Therapy (Signed)
[facility] [place], [place], [address] Phone: [phone]   Fax:  [phone]  Physical Therapy Treatment  Patient Details  Name: Carol Hoover MRN: [id number] Date of Birth: 1950/07/20 Referring Provider (PT): Gaynelle Arabian MD   Encounter Date: 06/05/2019  PT End of Session - 06/05/19 1531    Visit Number  6    Number of Visits  16    Date for PT Re-Evaluation  07/18/19    Authorization Type  FOTO.    PT Start Time  1430    PT Stop Time  1528    PT Time Calculation (min)  58 min    Activity Tolerance  Patient tolerated treatment well    Behavior During Therapy  [facility] for tasks assessed/performed       History reviewed. No pertinent past medical history.  Past Surgical History:  Procedure Laterality Date  . TUBAL LIGATION      There were no vitals filed for this visit.  Subjective Assessment - 06/05/19 1527    Subjective  COVID-19 screen performed prior to patient entering clinic.  Patient reports MD visit went well. Soreness level is at 3-4/10.    Pertinent History  Previous left knee scope,    How long can you walk comfortably?  Around house with [place].    Patient Stated Goals  Hike again.    Currently in Pain?  Yes    Pain Score  4     Pain Location  Knee    Pain Orientation  Left    Pain Descriptors / Indicators  Aching;Sore    Pain Type  Surgical pain    Pain Onset  1 to 4 weeks ago    Pain Frequency  Constant         [facility] PT Assessment - 06/05/19 0001      Assessment   Medical Diagnosis  Left total knee replacement.    Referring Provider (PT)  Gaynelle Arabian MD    Onset Date/Surgical Date  05/21/19    Next MD Visit  06/25/2019      ROM / Strength   AROM / PROM / Strength  PROM                   [facility] Adult PT Treatment/Exercise - 06/05/19 0001      Exercises   Exercises  Knee/Hip      Knee/Hip Exercises: Stretches   Passive Hamstring Stretch  Left;3 reps;30 seconds      Knee/Hip Exercises: Aerobic   Recumbent Bike  seat 5 x 15 min      Knee/Hip Exercises: Standing   Forward Lunges  Left;1 set;10 reps   on 14" box, 10" hold   Rocker Board  3 minutes      Knee/Hip Exercises: Supine   Straight Leg Raises  Left;1 set;15 reps    Straight Leg Raise with External Rotation  Left;15 reps      Modalities   Modalities  Vasopneumatic      Vasopneumatic   Number Minutes Vasopneumatic   15 minutes    Vasopnuematic Location   Knee    Vasopneumatic Pressure  Medium    Vasopneumatic Temperature   36 for edema               PT Short Term Goals - 05/23/19 1414      PT SHORT TERM GOAL #1   Title  STG's=LTG's.        PT Long Term  Goals - 05/23/19 1415      PT LONG TERM GOAL #1   Title  Independent with a HEP.    Time  8    Period  Weeks    Status  New      PT LONG TERM GOAL #2   Title  Active left knee flexion to 115 degrees+ so the patient can perform functional tasks and do so with pain not > 2-3/10    Time  8    Period  Weeks    Status  New      PT LONG TERM GOAL #3   Title  Full active knee extension in order to normalize gait.    Time  8    Period  Weeks    Status  New      PT LONG TERM GOAL #4   Title  Increase left knee strength to a solid 4+/5 to provide good stability for accomplishment of functional activities.    Time  8    Period  Weeks    Status  New      PT LONG TERM GOAL #5   Title  Perform a reciprocating stair gait with one railing with pain not > 2-3/10.    Time  8    Period  Weeks    Status  New      PT LONG TERM GOAL #6   Title  Walk 500 feet in clinic without deviation and no assisitve device.    Time  8    Period  Weeks    Status  New            Plan - 06/05/19 1533    Clinical Impression Statement  Patient responded well to therapy session with no reports of increased pain. Patient was able to complete TEs with good form and technique after explanation and demonstration. Patient's Aquacel  dressing was removed at follow up visit which scar appears to be healing well. No adverse affects upon removal of modalities.    Personal Factors and Comorbidities  Comorbidity 1    Comorbidities  Previous left knee scope    Examination-Activity Limitations  Other;Locomotion Level;Stairs;Sit    Examination-Participation Restrictions  Other    Stability/Clinical Decision Making  Stable/Uncomplicated    Clinical Decision Making  Low    Rehab Potential  Excellent    PT Treatment/Interventions  ADLs/Self Care Home Management;Cryotherapy;Electrical Stimulation;Moist Heat;Gait training;Stair training;Functional mobility training;Therapeutic activities;Therapeutic exercise;Neuromuscular re-education;Manual techniques;Patient/family education;Passive range of motion;Vasopneumatic Device    PT Next Visit Plan  Nustep with progression to bike, PROM.  Achieve full extension early. VMS to left quads.  Vasopneumatic beginning on low.    Consulted and Agree with Plan of Care  Patient       Patient will benefit from skilled therapeutic intervention in order to improve the following deficits and impairments:  Abnormal gait, Difficulty walking, Pain, Increased edema, Decreased strength, Decreased activity tolerance, Decreased range of motion  Visit Diagnosis: Chronic pain of left knee  Stiffness of left knee, not elsewhere classified  Localized edema  Muscle weakness (generalized)     Problem List Patient Active Problem List   Diagnosis Date Noted  . Osteoarthritis of knee 12/19/2018  . History of deep vein thrombosis 12/19/2018  . Acute deep vein thrombosis (DVT) of popliteal vein of left lower extremity ([place]) 04/25/2018  . Acute deep vein thrombosis (DVT) of left peroneal vein ([place]) 04/25/2018  . Well female exam with routine gynecological exam 03/22/2016  Gabriela Eves, PT, DPT 06/05/2019, 3:35 PM  [facility] [address] [place],  [place], [address] Phone: [phone]   Fax:  [phone]  Name: Carol Hoover MRN: [id number] Date of Birth: 23-Jun-1950

## 2019-06-06 ENCOUNTER — Ambulatory Visit: Payer: Medicare HMO | Attending: Orthopedic Surgery | Admitting: Physical Therapy

## 2019-06-06 ENCOUNTER — Encounter: Payer: Self-pay | Admitting: Physical Therapy

## 2019-06-06 ENCOUNTER — Other Ambulatory Visit: Payer: Self-pay

## 2019-06-06 DIAGNOSIS — G8929 Other chronic pain: Secondary | ICD-10-CM | POA: Insufficient documentation

## 2019-06-06 DIAGNOSIS — R600 Localized edema: Secondary | ICD-10-CM | POA: Insufficient documentation

## 2019-06-06 DIAGNOSIS — M25662 Stiffness of left knee, not elsewhere classified: Secondary | ICD-10-CM | POA: Diagnosis not present

## 2019-06-06 DIAGNOSIS — M25562 Pain in left knee: Secondary | ICD-10-CM | POA: Diagnosis not present

## 2019-06-06 DIAGNOSIS — M6281 Muscle weakness (generalized): Secondary | ICD-10-CM | POA: Insufficient documentation

## 2019-06-06 NOTE — Therapy (Signed)
[facility] [place], [place], [address] Phone: [phone]   Fax:  [phone]  Physical Therapy Treatment  Patient Details  Name: Carol Hoover MRN: [id number] Date of Birth: 1950/12/22 Referring Provider (PT): Gaynelle Arabian MD   Encounter Date: 06/06/2019  PT End of Session - 06/06/19 1513    Visit Number  7    Number of Visits  16    Date for PT Re-Evaluation  07/18/19    Authorization Type  FOTO.    PT Start Time  0230    PT Stop Time  0317    PT Time Calculation (min)  47 min    Activity Tolerance  Patient tolerated treatment well    Behavior During Therapy  [facility] for tasks assessed/performed       History reviewed. No pertinent past medical history.  Past Surgical History:  Procedure Laterality Date  . TUBAL LIGATION      There were no vitals filed for this visit.  Subjective Assessment - 06/06/19 1443    Subjective  COVID-19 screen performed prior to patient entering clinic.  Patient arrived with increased pain 5-7/10 reported    Pertinent History  Previous left knee scope,    How long can you walk comfortably?  Around house with [place].    Patient Stated Goals  Hike again.    Currently in Pain?  Yes    Pain Score  7    5-7   Pain Location  Knee    Pain Orientation  Left    Pain Descriptors / Indicators  Discomfort    Pain Type  Surgical pain    Pain Onset  1 to 4 weeks ago    Pain Frequency  Constant    Aggravating Factors   prolong activity    Pain Relieving Factors  rest         [facility] PT Assessment - 06/06/19 0001      AROM   AROM Assessment Site  Knee    Right/Left Knee  Left    Left Knee Extension  -10    Left Knee Flexion  109      PROM   PROM Assessment Site  Knee    Right/Left Knee  Left    Left Knee Extension  -6    Left Knee Flexion  115                   [facility] Adult PT Treatment/Exercise - 06/06/19 0001      Knee/Hip Exercises: Aerobic   Recumbent Bike  x1min  seat 5      Modalities   Modalities  Electrical Stimulation;Vasopneumatic      Electrical Stimulation   Electrical Stimulation Location  left knee    Electrical Stimulation Action  IFC    Electrical Stimulation Parameters  1-10hhz x52min    Electrical Stimulation Goals  Pain;Edema      Vasopneumatic   Number Minutes Vasopneumatic   15 minutes    Vasopnuematic Location   Knee    Vasopneumatic Pressure  Medium    Vasopneumatic Temperature   36 for edema      Manual Therapy   Manual Therapy  Passive ROM;Soft tissue mobilization    Soft tissue mobilization  manual STW to left knee to decrease pain    Passive ROM  manual PROM to left knee flexion and ext with low load holds               PT  Short Term Goals - 05/23/19 1414      PT SHORT TERM GOAL #1   Title  STG's=LTG's.        PT Long Term Goals - 06/05/19 1538      PT LONG TERM GOAL #1   Title  Independent with a HEP.    Time  8    Period  Weeks    Status  On-going      PT LONG TERM GOAL #2   Title  Active left knee flexion to 115 degrees+ so the patient can perform functional tasks and do so with pain not > 2-3/10    Time  8    Period  Weeks    Status  On-going      PT LONG TERM GOAL #3   Title  Full active knee extension in order to normalize gait.    Time  8    Period  Weeks    Status  On-going      PT LONG TERM GOAL #4   Title  Increase left knee strength to a solid 4+/5 to provide good stability for accomplishment of functional activities.    Time  8    Period  Weeks    Status  On-going      PT LONG TERM GOAL #5   Title  Perform a reciprocating stair gait with one railing with pain not > 2-3/10.    Time  8    Period  Weeks    Status  On-going      PT LONG TERM GOAL #6   Title  Walk 500 feet in clinic without deviation and no assisitve device.    Time  8    Period  Weeks    Status  On-going            Plan - 06/06/19 1520    Clinical Impression Statement  Patient tolerated  treatment fair due to increased pain in knee. Today focused on gentle ROM followed by modalities to reduce pain. Patient ROM continues to improve. Current goals ongoing due to pain and limitations.    Personal Factors and Comorbidities  Comorbidity 1    Comorbidities  Previous left knee scope    Examination-Activity Limitations  Other;Locomotion Level;Stairs;Sit    Examination-Participation Restrictions  Other    Stability/Clinical Decision Making  Stable/Uncomplicated    Rehab Potential  Excellent    PT Treatment/Interventions  ADLs/Self Care Home Management;Cryotherapy;Electrical Stimulation;Moist Heat;Gait training;Stair training;Functional mobility training;Therapeutic activities;Therapeutic exercise;Neuromuscular re-education;Manual techniques;Patient/family education;Passive range of motion;Vasopneumatic Device    PT Next Visit Plan  Nustep with progression to bike, PROM.  Achieve full extension early. VMS to left quads.  Vasopneumatic beginning on low.    Consulted and Agree with Plan of Care  Patient       Patient will benefit from skilled therapeutic intervention in order to improve the following deficits and impairments:  Abnormal gait, Difficulty walking, Pain, Increased edema, Decreased strength, Decreased activity tolerance, Decreased range of motion  Visit Diagnosis: Chronic pain of left knee  Stiffness of left knee, not elsewhere classified  Localized edema  Muscle weakness (generalized)     Problem List Patient Active Problem List   Diagnosis Date Noted  . Osteoarthritis of knee 12/19/2018  . History of deep vein thrombosis 12/19/2018  . Acute deep vein thrombosis (DVT) of popliteal vein of left lower extremity ([place]) 04/25/2018  . Acute deep vein thrombosis (DVT) of left peroneal vein ([place]) 04/25/2018  . Well female exam with  routine gynecological exam 03/22/2016    Phillips Climes, PTA 06/06/2019, 3:26 PM  [facility] [place], [place], [address] Phone: [phone]   Fax:  [phone]  Name: Carol Hoover MRN: [id number] Date of Birth: 05-25-1950

## 2019-06-10 ENCOUNTER — Ambulatory Visit: Payer: Medicare HMO | Admitting: Physical Therapy

## 2019-06-10 ENCOUNTER — Other Ambulatory Visit: Payer: Self-pay

## 2019-06-10 DIAGNOSIS — M25562 Pain in left knee: Secondary | ICD-10-CM

## 2019-06-10 DIAGNOSIS — R600 Localized edema: Secondary | ICD-10-CM

## 2019-06-10 DIAGNOSIS — M6281 Muscle weakness (generalized): Secondary | ICD-10-CM | POA: Diagnosis not present

## 2019-06-10 DIAGNOSIS — G8929 Other chronic pain: Secondary | ICD-10-CM

## 2019-06-10 DIAGNOSIS — M25662 Stiffness of left knee, not elsewhere classified: Secondary | ICD-10-CM | POA: Diagnosis not present

## 2019-06-10 NOTE — Therapy (Signed)
[facility] [place], [place], [address] Phone: [phone]   Fax:  [phone]  Physical Therapy Treatment  Patient Details  Name: Carol Hoover MRN: [id number] Date of Birth: 1950/08/05 Referring Provider (PT): Gaynelle Arabian MD   Encounter Date: 06/10/2019  PT End of Session - 06/10/19 1207    Visit Number  8    Number of Visits  16    Date for PT Re-Evaluation  07/18/19    Authorization Type  FOTO.    PT Start Time  1115    PT Stop Time  1203    PT Time Calculation (min)  48 min    Activity Tolerance  Patient tolerated treatment well    Behavior During Therapy  [facility] for tasks assessed/performed       No past medical history on file.  Past Surgical History:  Procedure Laterality Date  . TUBAL LIGATION      There were no vitals filed for this visit.  Subjective Assessment - 06/10/19 1129    Subjective  COVID-19 screen performed prior to patient entering clinic.  I mowed my yard.    Pertinent History  Previous left knee scope,    How long can you walk comfortably?  Around house with [place].    Patient Stated Goals  Hike again.    Currently in Pain?  Yes    Pain Score  3     Pain Location  Knee    Pain Orientation  Left    Pain Descriptors / Indicators  Discomfort    Pain Type  Surgical pain    Pain Onset  1 to 4 weeks ago    Pain Frequency  Constant         [facility] PT Assessment - 06/10/19 0001      PROM   Left Knee Flexion  120 degrees                   [facility] Adult PT Treatment/Exercise - 06/10/19 0001      Exercises   Exercises  Knee/Hip      Knee/Hip Exercises: Aerobic   Recumbent Bike  15 minutes progressing to seat 3.      Knee/Hip Exercises: Machines for Strengthening   Cybex Knee Extension  10# x 4 minutes.    Cybex Knee Flexion  30# x 4 minutes.      Modalities   Modalities  Health visitor Stimulation Location   Left knee.    Electrical Stimulation Action  IFC    Electrical Stimulation Parameters  1-10 Hz x 15 minutes.    Electrical Stimulation Goals  Edema;Pain      Vasopneumatic   Number Minutes Vasopneumatic   15 minutes    Vasopnuematic Location   --   Left knee.   Vasopneumatic Pressure  Low      Manual Therapy   Manual Therapy  Passive ROM    Passive ROM  In supine:  Passive range of motion to patient tolerance x 3 minutes into flexion and extension.               PT Short Term Goals - 05/23/19 1414      PT SHORT TERM GOAL #1   Title  STG's=LTG's.        PT Long Term Goals - 06/05/19 1538      PT LONG TERM GOAL #1   Title  Independent with a  HEP.    Time  8    Period  Weeks    Status  On-going      PT LONG TERM GOAL #2   Title  Active left knee flexion to 115 degrees+ so the patient can perform functional tasks and do so with pain not > 2-3/10    Time  8    Period  Weeks    Status  On-going      PT LONG TERM GOAL #3   Title  Full active knee extension in order to normalize gait.    Time  8    Period  Weeks    Status  On-going      PT LONG TERM GOAL #4   Title  Increase left knee strength to a solid 4+/5 to provide good stability for accomplishment of functional activities.    Time  8    Period  Weeks    Status  On-going      PT LONG TERM GOAL #5   Title  Perform a reciprocating stair gait with one railing with pain not > 2-3/10.    Time  8    Period  Weeks    Status  On-going      PT LONG TERM GOAL #6   Title  Walk 500 feet in clinic without deviation and no assisitve device.    Time  8    Period  Weeks    Status  On-going            Plan - 06/10/19 1201    Clinical Impression Statement  Excellent progress moving on bike to seat 3 today and passive right knee flexion to 120 degrees.    Personal Factors and Comorbidities  Comorbidity 1    Comorbidities  Previous left knee scope    Examination-Activity Limitations  Other;Locomotion  Level;Stairs;Sit    Examination-Participation Restrictions  Other    Stability/Clinical Decision Making  Stable/Uncomplicated    Rehab Potential  Excellent    PT Treatment/Interventions  ADLs/Self Care Home Management;Cryotherapy;Electrical Stimulation;Moist Heat;Gait training;Stair training;Functional mobility training;Therapeutic activities;Therapeutic exercise;Neuromuscular re-education;Manual techniques;Patient/family education;Passive range of motion;Vasopneumatic Device    PT Next Visit Plan  Nustep with progression to bike, PROM.  Achieve full extension early. VMS to left quads.  Vasopneumatic beginning on low.    Consulted and Agree with Plan of Care  Patient       Patient will benefit from skilled therapeutic intervention in order to improve the following deficits and impairments:  Abnormal gait, Difficulty walking, Pain, Increased edema, Decreased strength, Decreased activity tolerance, Decreased range of motion  Visit Diagnosis: Chronic pain of left knee  Stiffness of left knee, not elsewhere classified  Localized edema  Muscle weakness (generalized)     Problem List Patient Active Problem List   Diagnosis Date Noted  . Osteoarthritis of knee 12/19/2018  . History of deep vein thrombosis 12/19/2018  . Acute deep vein thrombosis (DVT) of popliteal vein of left lower extremity ([place]) 04/25/2018  . Acute deep vein thrombosis (DVT) of left peroneal vein ([place]) 04/25/2018  . Well female exam with routine gynecological exam 03/22/2016    Carol Hoover, [place] MPT 06/10/2019, 12:08 PM  [facility] [place], [place], [address] Phone: [phone]   Fax:  [phone]  Name: Carol Hoover MRN: [id number] Date of Birth: [birth year]

## 2019-06-12 ENCOUNTER — Other Ambulatory Visit: Payer: Self-pay

## 2019-06-12 ENCOUNTER — Ambulatory Visit: Payer: Medicare HMO | Admitting: Physical Therapy

## 2019-06-12 DIAGNOSIS — M25662 Stiffness of left knee, not elsewhere classified: Secondary | ICD-10-CM | POA: Diagnosis not present

## 2019-06-12 DIAGNOSIS — M25562 Pain in left knee: Secondary | ICD-10-CM

## 2019-06-12 DIAGNOSIS — G8929 Other chronic pain: Secondary | ICD-10-CM | POA: Diagnosis not present

## 2019-06-12 DIAGNOSIS — R600 Localized edema: Secondary | ICD-10-CM | POA: Diagnosis not present

## 2019-06-12 DIAGNOSIS — M6281 Muscle weakness (generalized): Secondary | ICD-10-CM | POA: Diagnosis not present

## 2019-06-12 NOTE — Therapy (Signed)
[facility] [place], [place], [address] Phone: [phone]   Fax:  [phone]  Physical Therapy Treatment  Patient Details  Name: Carol Hoover MRN: [id number] Date of Birth: 1951-01-24 Referring Provider (PT): Gaynelle Arabian MD   Encounter Date: 06/12/2019  PT End of Session - 06/12/19 1243    Visit Number  9    Number of Visits  16    Date for PT Re-Evaluation  07/18/19    Authorization Type  FOTO.    PT Start Time  1115    PT Stop Time  1201    PT Time Calculation (min)  46 min    Activity Tolerance  Patient tolerated treatment well    Behavior During Therapy  [facility] for tasks assessed/performed       No past medical history on file.  Past Surgical History:  Procedure Laterality Date  . TUBAL LIGATION      There were no vitals filed for this visit.  Subjective Assessment - 06/12/19 1230    Subjective  COVID-19 screen performed prior to patient entering clinic.  Real sore after last tretament.    Pertinent History  Previous left knee scope,    How long can you walk comfortably?  Around house with [place].    Patient Stated Goals  Hike again.    Currently in Pain?  Yes    Pain Score  5     Pain Location  Knee    Pain Descriptors / Indicators  Discomfort    Pain Type  Surgical pain                       [facility] Adult PT Treatment/Exercise - 06/12/19 0001      Exercises   Exercises  Knee/Hip      Knee/Hip Exercises: Aerobic   Recumbent Bike  15 minutes beginning at seat 7 and finishing at seat 3.      Modalities   Modalities  Health visitor Stimulation Location  Left knee.    Electrical Stimulation Action  IFC    Electrical Stimulation Parameters  1-10 Hz x 15 minutes.    Electrical Stimulation Goals  Edema;Pain      Vasopneumatic   Number Minutes Vasopneumatic   15 minutes    Vasopnuematic Location   --   Left knee.   Vasopneumatic Pressure  Low      Manual Therapy   Manual Therapy  Passive ROM    Passive ROM  In supine:  PROM including left knee overpressure stretching, hamstring and calf stretching x 8 minutes.               PT Short Term Goals - 05/23/19 1414      PT SHORT TERM GOAL #1   Title  STG's=LTG's.        PT Long Term Goals - 06/05/19 1538      PT LONG TERM GOAL #1   Title  Independent with a HEP.    Time  8    Period  Weeks    Status  On-going      PT LONG TERM GOAL #2   Title  Active left knee flexion to 115 degrees+ so the patient can perform functional tasks and do so with pain not > 2-3/10    Time  8    Period  Weeks    Status  On-going  PT LONG TERM GOAL #3   Title  Full active knee extension in order to normalize gait.    Time  8    Period  Weeks    Status  On-going      PT LONG TERM GOAL #4   Title  Increase left knee strength to a solid 4+/5 to provide good stability for accomplishment of functional activities.    Time  8    Period  Weeks    Status  On-going      PT LONG TERM GOAL #5   Title  Perform a reciprocating stair gait with one railing with pain not > 2-3/10.    Time  8    Period  Weeks    Status  On-going      PT LONG TERM GOAL #6   Title  Walk 500 feet in clinic without deviation and no assisitve device.    Time  8    Period  Weeks    Status  On-going            Plan - 06/12/19 1233    Clinical Impression Statement  Patient sore after last treatment.  Did not perform weight machines today.  Her left knee improved nicely today.    Personal Factors and Comorbidities  Comorbidity 1    Comorbidities  Previous left knee scope    Examination-Activity Limitations  Other;Locomotion Level;Stairs;Sit    Examination-Participation Restrictions  Other    Stability/Clinical Decision Making  Stable/Uncomplicated    Rehab Potential  Excellent    PT Treatment/Interventions  ADLs/Self Care Home Management;Cryotherapy;Electrical  Stimulation;Moist Heat;Gait training;Stair training;Functional mobility training;Therapeutic activities;Therapeutic exercise;Neuromuscular re-education;Manual techniques;Patient/family education;Passive range of motion;Vasopneumatic Device    PT Next Visit Plan  Nustep with progression to bike, PROM.  Achieve full extension early. VMS to left quads.  Vasopneumatic beginning on low.    Consulted and Agree with Plan of Care  Patient       Patient will benefit from skilled therapeutic intervention in order to improve the following deficits and impairments:  Abnormal gait, Difficulty walking, Pain, Increased edema, Decreased strength, Decreased activity tolerance, Decreased range of motion  Visit Diagnosis: Chronic pain of left knee  Stiffness of left knee, not elsewhere classified  Localized edema  Muscle weakness (generalized)     Problem List Patient Active Problem List   Diagnosis Date Noted  . Osteoarthritis of knee 12/19/2018  . History of deep vein thrombosis 12/19/2018  . Acute deep vein thrombosis (DVT) of popliteal vein of left lower extremity ([place]) 04/25/2018  . Acute deep vein thrombosis (DVT) of left peroneal vein ([place]) 04/25/2018  . Well female exam with routine gynecological exam 03/22/2016    Raedyn Klinck, [place] MPT 06/12/2019, 12:44 PM  [facility] [place], [place], [address] Phone: [phone]   Fax:  [phone]  Name: Carol Hoover MRN: [id number] Date of Birth: December 23, 1950

## 2019-06-14 ENCOUNTER — Other Ambulatory Visit: Payer: Self-pay

## 2019-06-14 ENCOUNTER — Ambulatory Visit: Payer: Medicare HMO | Admitting: Physical Therapy

## 2019-06-14 DIAGNOSIS — M25662 Stiffness of left knee, not elsewhere classified: Secondary | ICD-10-CM

## 2019-06-14 DIAGNOSIS — G8929 Other chronic pain: Secondary | ICD-10-CM | POA: Diagnosis not present

## 2019-06-14 DIAGNOSIS — M25562 Pain in left knee: Secondary | ICD-10-CM | POA: Diagnosis not present

## 2019-06-14 DIAGNOSIS — R600 Localized edema: Secondary | ICD-10-CM

## 2019-06-14 DIAGNOSIS — M6281 Muscle weakness (generalized): Secondary | ICD-10-CM | POA: Diagnosis not present

## 2019-06-14 NOTE — Therapy (Signed)
[facility] [place], [place], [address] Phone: [phone]   Fax:  [phone]  Physical Therapy Treatment  Patient Details  Name: Carol Hoover MRN: [id number] Date of Birth: Jan 13, 1951 Referring Provider (PT): Gaynelle Arabian MD   Encounter Date: 06/14/2019  PT End of Session - 06/14/19 1209    Visit Number  10    Number of Visits  16    Date for PT Re-Evaluation  07/18/19    Authorization Type  FOTO.    PT Start Time  1120    PT Stop Time  1208    PT Time Calculation (min)  48 min    Activity Tolerance  Patient tolerated treatment well    Behavior During Therapy  [facility] for tasks assessed/performed       No past medical history on file.  Past Surgical History:  Procedure Laterality Date  . TUBAL LIGATION      There were no vitals filed for this visit.  Subjective Assessment - 06/14/19 1151    Subjective  COVID-19 screen performed prior to patient entering clinic.  Sore from getting my back adjusted.  Also mowed and washed my mower yesterday and my knee swelled a lot.    Pertinent History  Previous left knee scope,    How long can you walk comfortably?  Around house with [place].    Patient Stated Goals  Hike again.    Currently in Pain?  Yes    Pain Score  5     Pain Location  Knee    Pain Orientation  Left    Pain Descriptors / Indicators  Discomfort    Pain Type  Surgical pain    Pain Onset  1 to 4 weeks ago                       [facility] Adult PT Treatment/Exercise - 06/14/19 0001      Exercises   Exercises  Knee/Hip      Knee/Hip Exercises: Aerobic   Recumbent Bike  15 minutes.      Knee/Hip Exercises: Standing   Other Standing Knee Exercises  12 inch box lunges x 3 minutes.    Other Standing Knee Exercises  Rockerboard x 3 minutes.      Modalities   Modalities  Health visitor Stimulation Location  Left knee.     Electrical Stimulation Action  IFC    Electrical Stimulation Parameters  1-10 Hz x 15 minutes.    Electrical Stimulation Goals  Edema;Pain      Vasopneumatic   Number Minutes Vasopneumatic   15 minutes    Vasopnuematic Location   --   Left knee.   Vasopneumatic Pressure  Low      Manual Therapy   Manual Therapy  Passive ROM    Passive ROM  In supine:  6 minutes with emphasis on extension.               PT Short Term Goals - 05/23/19 1414      PT SHORT TERM GOAL #1   Title  STG's=LTG's.        PT Long Term Goals - 06/14/19 1215      PT LONG TERM GOAL #1   Title  Independent with a HEP.    Time  8    Period  Weeks    Status  On-going  PT LONG TERM GOAL #2   Title  Active left knee flexion to 115 degrees+ so the patient can perform functional tasks and do so with pain not > 2-3/10    Time  8    Period  Weeks    Status  Achieved      PT LONG TERM GOAL #3   Title  Full active knee extension in order to normalize gait.    Time  8    Period  Weeks    Status  On-going      PT LONG TERM GOAL #4   Title  Increase left knee strength to a solid 4+/5 to provide good stability for accomplishment of functional activities.    Time  8    Period  Weeks    Status  On-going      PT LONG TERM GOAL #5   Title  Perform a reciprocating stair gait with one railing with pain not > 2-3/10.    Time  8    Period  Weeks    Status  On-going      PT LONG TERM GOAL #6   Title  Walk 500 feet in clinic without deviation and no assisitve device.    Time  8    Period  Weeks    Status  On-going            Plan - 06/14/19 1210    Clinical Impression Statement  Patient did well today.  Sore from a chiropratic manipulation to her spine and knee had swollen due to washing her lawn mower.  She did well with the addition of rockerboard and box lunges.    Personal Factors and Comorbidities  Comorbidity 1    Comorbidities  Previous left knee scope    Examination-Activity  Limitations  Other;Locomotion Level;Stairs;Sit    Examination-Participation Restrictions  Other    Stability/Clinical Decision Making  Stable/Uncomplicated    Rehab Potential  Excellent    PT Treatment/Interventions  ADLs/Self Care Home Management;Cryotherapy;Electrical Stimulation;Moist Heat;Gait training;Stair training;Functional mobility training;Therapeutic activities;Therapeutic exercise;Neuromuscular re-education;Manual techniques;Patient/family education;Passive range of motion;Vasopneumatic Device    PT Next Visit Plan  Nustep with progression to bike, PROM.  Achieve full extension early. VMS to left quads.  Vasopneumatic beginning on low.       Patient will benefit from skilled therapeutic intervention in order to improve the following deficits and impairments:  Abnormal gait, Difficulty walking, Pain, Increased edema, Decreased strength, Decreased activity tolerance, Decreased range of motion  Visit Diagnosis: Chronic pain of left knee  Stiffness of left knee, not elsewhere classified  Localized edema  Muscle weakness (generalized)     Problem List Patient Active Problem List   Diagnosis Date Noted  . Osteoarthritis of knee 12/19/2018  . History of deep vein thrombosis 12/19/2018  . Acute deep vein thrombosis (DVT) of popliteal vein of left lower extremity ([place]) 04/25/2018  . Acute deep vein thrombosis (DVT) of left peroneal vein ([place]) 04/25/2018  . Well female exam with routine gynecological exam 03/22/2016   Progress Note Reporting Period 05/23/19 to 06/14/19  See note below for Objective Data and Assessment of Progress/Goals.  LTG #2 met.  Patient is making excellent progress toward all goals.     Roberts Bon, [place] MPT 06/14/2019, 12:16 PM  [facility] [address] [place], [place], [address] Phone: [phone]   Fax:  [phone]  Name: Carol Hoover MRN: [id number] Date of Birth: Feb 20, 1951

## 2019-06-15 DIAGNOSIS — Z833 Family history of diabetes mellitus: Secondary | ICD-10-CM | POA: Diagnosis not present

## 2019-06-15 DIAGNOSIS — Z7982 Long term (current) use of aspirin: Secondary | ICD-10-CM | POA: Diagnosis not present

## 2019-06-15 DIAGNOSIS — Z85828 Personal history of other malignant neoplasm of skin: Secondary | ICD-10-CM | POA: Diagnosis not present

## 2019-06-15 DIAGNOSIS — Z8249 Family history of ischemic heart disease and other diseases of the circulatory system: Secondary | ICD-10-CM | POA: Diagnosis not present

## 2019-06-15 DIAGNOSIS — G629 Polyneuropathy, unspecified: Secondary | ICD-10-CM | POA: Diagnosis not present

## 2019-06-15 DIAGNOSIS — Z87891 Personal history of nicotine dependence: Secondary | ICD-10-CM | POA: Diagnosis not present

## 2019-06-15 DIAGNOSIS — R030 Elevated blood-pressure reading, without diagnosis of hypertension: Secondary | ICD-10-CM | POA: Diagnosis not present

## 2019-06-15 DIAGNOSIS — R69 Illness, unspecified: Secondary | ICD-10-CM | POA: Diagnosis not present

## 2019-06-15 DIAGNOSIS — Z79891 Long term (current) use of opiate analgesic: Secondary | ICD-10-CM | POA: Diagnosis not present

## 2019-06-17 ENCOUNTER — Ambulatory Visit: Payer: Medicare HMO | Admitting: Physical Therapy

## 2019-06-17 ENCOUNTER — Other Ambulatory Visit: Payer: Self-pay

## 2019-06-17 DIAGNOSIS — R600 Localized edema: Secondary | ICD-10-CM

## 2019-06-17 DIAGNOSIS — M25562 Pain in left knee: Secondary | ICD-10-CM

## 2019-06-17 DIAGNOSIS — M6281 Muscle weakness (generalized): Secondary | ICD-10-CM | POA: Diagnosis not present

## 2019-06-17 DIAGNOSIS — G8929 Other chronic pain: Secondary | ICD-10-CM

## 2019-06-17 DIAGNOSIS — M25662 Stiffness of left knee, not elsewhere classified: Secondary | ICD-10-CM

## 2019-06-17 NOTE — Therapy (Signed)
[facility] [place], [place], [address] Phone: [phone]   Fax:  [phone]  Physical Therapy Treatment  Patient Details  Name: Carol Hoover MRN: [id number] Date of Birth: [birth year] Referring Provider (PT): Gaynelle Arabian MD   Encounter Date: 06/17/2019  PT End of Session - 06/17/19 1427    Visit Number  11    Number of Visits  16    Date for PT Re-Evaluation  07/18/19    Authorization Type  FOTO.    PT Start Time  0145    PT Stop Time  0239    PT Time Calculation (min)  54 min    Activity Tolerance  Patient tolerated treatment well    Behavior During Therapy  [facility] for tasks assessed/performed       No past medical history on file.  Past Surgical History:  Procedure Laterality Date  . TUBAL LIGATION      There were no vitals filed for this visit.  Subjective Assessment - 06/17/19 1405    Subjective  COVID-19 screen performed prior to patient entering clinic.    Pertinent History  Previous left knee scope,    How long can you walk comfortably?  Around house with [place].    Patient Stated Goals  Hike again.    Currently in Pain?  Yes    Pain Score  1     Pain Location  Knee    Pain Orientation  Left    Pain Descriptors / Indicators  Discomfort    Pain Type  Surgical pain                       [facility] Adult PT Treatment/Exercise - 06/17/19 0001      Exercises   Exercises  Knee/Hip      Knee/Hip Exercises: Aerobic   Recumbent Bike  15 minutes moving forward to seat 2.      Knee/Hip Exercises: Machines for Strengthening   Cybex Knee Extension  10# x 3 minutes.    Cybex Knee Flexion  30# x 3 minutes    Cybex Leg Press  2 plates x 3 minutes.      Modalities   Modalities  Health visitor Stimulation Location  Left knee.    Electrical Stimulation Action  IFC    Electrical Stimulation Parameters  1-10 Hz x 20 minutes.    Electrical Stimulation Goals  Edema;Pain      Vasopneumatic   Number Minutes Vasopneumatic   20 minutes    Vasopnuematic Location   --   Left knee.   Vasopneumatic Pressure  Low      Manual Therapy   Manual Therapy  Passive ROM    Passive ROM  In supine:  3 minutes sustained left knee extension stretch.               PT Short Term Goals - 05/23/19 1414      PT SHORT TERM GOAL #1   Title  STG's=LTG's.        PT Long Term Goals - 06/14/19 1215      PT LONG TERM GOAL #1   Title  Independent with a HEP.    Time  8    Period  Weeks    Status  On-going      PT LONG TERM GOAL #2   Title  Active left knee flexion to 115 degrees+ so the  patient can perform functional tasks and do so with pain not > 2-3/10    Time  8    Period  Weeks    Status  Achieved      PT LONG TERM GOAL #3   Title  Full active knee extension in order to normalize gait.    Time  8    Period  Weeks    Status  On-going      PT LONG TERM GOAL #4   Title  Increase left knee strength to a solid 4+/5 to provide good stability for accomplishment of functional activities.    Time  8    Period  Weeks    Status  On-going      PT LONG TERM GOAL #5   Title  Perform a reciprocating stair gait with one railing with pain not > 2-3/10.    Time  8    Period  Weeks    Status  On-going      PT LONG TERM GOAL #6   Title  Walk 500 feet in clinic without deviation and no assisitve device.    Time  8    Period  Weeks    Status  On-going            Plan - 06/17/19 1428    Clinical Impression Statement  Patient did very well progressing to seat 2 on bike today.  She states she was compliant with her prone hang exercise at home and was able to make it 5 minutes.  Added leg press today which she performed without complaint.    Personal Factors and Comorbidities  Comorbidity 1    Comorbidities  Previous left knee scope    Examination-Activity Limitations  Other;Locomotion Level;Stairs;Sit     Examination-Participation Restrictions  Other    Stability/Clinical Decision Making  Stable/Uncomplicated    Rehab Potential  Excellent    PT Treatment/Interventions  ADLs/Self Care Home Management;Cryotherapy;Electrical Stimulation;Moist Heat;Gait training;Stair training;Functional mobility training;Therapeutic activities;Therapeutic exercise;Neuromuscular re-education;Manual techniques;Patient/family education;Passive range of motion;Vasopneumatic Device    PT Next Visit Plan  Nustep with progression to bike, PROM.  Achieve full extension early. VMS to left quads.  Vasopneumatic beginning on low.    Consulted and Agree with Plan of Care  Patient       Patient will benefit from skilled therapeutic intervention in order to improve the following deficits and impairments:  Abnormal gait, Difficulty walking, Pain, Increased edema, Decreased strength, Decreased activity tolerance, Decreased range of motion  Visit Diagnosis: Chronic pain of left knee  Stiffness of left knee, not elsewhere classified  Localized edema  Muscle weakness (generalized)     Problem List Patient Active Problem List   Diagnosis Date Noted  . Osteoarthritis of knee 12/19/2018  . History of deep vein thrombosis 12/19/2018  . Acute deep vein thrombosis (DVT) of popliteal vein of left lower extremity ([place]) 04/25/2018  . Acute deep vein thrombosis (DVT) of left peroneal vein ([place]) 04/25/2018  . Well female exam with routine gynecological exam 03/22/2016    Kamiryn Bezanson, [place] MPT 06/17/2019, 2:41 PM  [facility] [address] [place], [place], [address] Phone: [phone]   Fax:  [phone]  Name: Carol Hoover MRN: [id number] Date of Birth: 03-25-1950

## 2019-06-19 ENCOUNTER — Ambulatory Visit: Payer: Medicare HMO | Admitting: Physical Therapy

## 2019-06-19 ENCOUNTER — Encounter: Payer: Self-pay | Admitting: Physical Therapy

## 2019-06-19 ENCOUNTER — Other Ambulatory Visit: Payer: Self-pay

## 2019-06-19 DIAGNOSIS — R600 Localized edema: Secondary | ICD-10-CM | POA: Diagnosis not present

## 2019-06-19 DIAGNOSIS — G8929 Other chronic pain: Secondary | ICD-10-CM

## 2019-06-19 DIAGNOSIS — M6281 Muscle weakness (generalized): Secondary | ICD-10-CM

## 2019-06-19 DIAGNOSIS — M25562 Pain in left knee: Secondary | ICD-10-CM | POA: Diagnosis not present

## 2019-06-19 DIAGNOSIS — M25662 Stiffness of left knee, not elsewhere classified: Secondary | ICD-10-CM | POA: Diagnosis not present

## 2019-06-19 NOTE — Therapy (Signed)
[facility] [place], [place], [address] Phone: [phone]   Fax:  [phone]  Physical Therapy Treatment  Patient Details  Name: Carol Hoover MRN: [id number] Date of Birth: 1950/11/02 Referring Provider (PT): Gaynelle Arabian MD   Encounter Date: 06/19/2019  PT End of Session - 06/19/19 1437    Visit Number  12    Number of Visits  16    Date for PT Re-Evaluation  07/18/19    Authorization Type  FOTO.    PT Start Time  1419    PT Stop Time  1510    PT Time Calculation (min)  51 min    Activity Tolerance  Patient tolerated treatment well    Behavior During Therapy  [facility] for tasks assessed/performed       History reviewed. No pertinent past medical history.  Past Surgical History:  Procedure Laterality Date  . TUBAL LIGATION      There were no vitals filed for this visit.  Subjective Assessment - 06/19/19 1430    Subjective  COVID-19 screen performed prior to patient entering clinic. Reports she has had more pain and less sleep since Monday's PT session.    Pertinent History  Previous left knee scope,    How long can you walk comfortably?  Around house with [place].    Patient Stated Goals  Hike again.    Currently in Pain?  Yes    Pain Location  Knee    Pain Orientation  Left    Pain Descriptors / Indicators  Discomfort    Pain Type  Surgical pain    Pain Onset  1 to 4 weeks ago    Pain Frequency  Constant         [facility] PT Assessment - 06/19/19 0001      Assessment   Medical Diagnosis  Left total knee replacement.    Referring Provider (PT)  Gaynelle Arabian MD    Onset Date/Surgical Date  05/21/19    Next MD Visit  06/25/2019      Restrictions   Weight Bearing Restrictions  No      ROM / Strength   AROM / PROM / Strength  AROM      AROM   Overall AROM   Within functional limits for tasks performed    AROM Assessment Site  Knee    Right/Left Knee  Left    Left Knee Extension  3    Left Knee Flexion   115                   [facility] Adult PT Treatment/Exercise - 06/19/19 0001      Knee/Hip Exercises: Aerobic   Recumbent Bike  x15 min      Knee/Hip Exercises: Standing   Forward Step Up  Left;5 reps;Hand Hold: 2;Step Height: 6"    Wall Squat  15 reps;3 seconds    Rocker Board  5 minutes      Knee/Hip Exercises: Seated   Long Arc Quad  Strengthening;Left;3 sets;10 reps;Weights    Long Arc Quad Weight  4 lbs.      Modalities   Modalities  Health visitor Stimulation Location  Left knee.    Electrical Stimulation Action  Pre-Mod    Electrical Stimulation Parameters  80-150 hz x10 min    Electrical Stimulation Goals  Edema;Pain      Vasopneumatic   Number Minutes Vasopneumatic  10 minutes    Vasopnuematic Location   Knee    Vasopneumatic Pressure  Medium    Vasopneumatic Temperature   36 for edema      Manual Therapy   Manual Therapy  Passive ROM;Soft tissue mobilization    Soft tissue mobilization  L knee incision mobilizations with education for patient    Passive ROM  PROM of L knee into extension with holds at end range               PT Short Term Goals - 05/23/19 1414      PT SHORT TERM GOAL #1   Title  STG's=LTG's.        PT Long Term Goals - 06/14/19 1215      PT LONG TERM GOAL #1   Title  Independent with a HEP.    Time  8    Period  Weeks    Status  On-going      PT LONG TERM GOAL #2   Title  Active left knee flexion to 115 degrees+ so the patient can perform functional tasks and do so with pain not > 2-3/10    Time  8    Period  Weeks    Status  Achieved      PT LONG TERM GOAL #3   Title  Full active knee extension in order to normalize gait.    Time  8    Period  Weeks    Status  On-going      PT LONG TERM GOAL #4   Title  Increase left knee strength to a solid 4+/5 to provide good stability for accomplishment of functional activities.    Time  8    Period   Weeks    Status  On-going      PT LONG TERM GOAL #5   Title  Perform a reciprocating stair gait with one railing with pain not > 2-3/10.    Time  8    Period  Weeks    Status  On-going      PT LONG TERM GOAL #6   Title  Walk 500 feet in clinic without deviation and no assisitve device.    Time  8    Period  Weeks    Status  On-going            Plan - 06/19/19 1507    Clinical Impression Statement  Patient presented in clinic with reports of increased pain after last PT session. A lighter strengthening session completed today secondary to pain reports with machine strengthening. Patient now ambulating stairs reciprically without complaint. Patient's AROM L knee measured as 3-115 deg. Normal modalities response noted following removal of the modalities.    Personal Factors and Comorbidities  Comorbidity 1    Comorbidities  Previous left knee scope    Examination-Activity Limitations  Other;Locomotion Level;Stairs;Sit    Examination-Participation Restrictions  Other    Stability/Clinical Decision Making  Stable/Uncomplicated    Rehab Potential  Excellent    PT Treatment/Interventions  ADLs/Self Care Home Management;Cryotherapy;Electrical Stimulation;Moist Heat;Gait training;Stair training;Functional mobility training;Therapeutic activities;Therapeutic exercise;Neuromuscular re-education;Manual techniques;Patient/family education;Passive range of motion;Vasopneumatic Device    PT Next Visit Plan  Nustep with progression to bike, PROM.  Achieve full extension early. VMS to left quads.  Vasopneumatic beginning on low.    Consulted and Agree with Plan of Care  Patient       Patient will benefit from skilled therapeutic intervention in order to improve the following deficits  and impairments:  Abnormal gait, Difficulty walking, Pain, Increased edema, Decreased strength, Decreased activity tolerance, Decreased range of motion  Visit Diagnosis: Chronic pain of left knee  Stiffness of  left knee, not elsewhere classified  Localized edema  Muscle weakness (generalized)     Problem List Patient Active Problem List   Diagnosis Date Noted  . Osteoarthritis of knee 12/19/2018  . History of deep vein thrombosis 12/19/2018  . Acute deep vein thrombosis (DVT) of popliteal vein of left lower extremity ([place]) 04/25/2018  . Acute deep vein thrombosis (DVT) of left peroneal vein ([place]) 04/25/2018  . Well female exam with routine gynecological exam 03/22/2016    Standley Brooking, PTA 06/19/2019, 3:24 PM  [facility] [place], [place], [address] Phone: [phone]   Fax:  [phone]  Name: Carol Hoover MRN: [id number] Date of Birth: Dec 12, 1950

## 2019-06-21 ENCOUNTER — Ambulatory Visit: Payer: Medicare HMO | Admitting: Physical Therapy

## 2019-06-21 ENCOUNTER — Other Ambulatory Visit: Payer: Self-pay

## 2019-06-21 DIAGNOSIS — R600 Localized edema: Secondary | ICD-10-CM | POA: Diagnosis not present

## 2019-06-21 DIAGNOSIS — M25662 Stiffness of left knee, not elsewhere classified: Secondary | ICD-10-CM

## 2019-06-21 DIAGNOSIS — G8929 Other chronic pain: Secondary | ICD-10-CM | POA: Diagnosis not present

## 2019-06-21 DIAGNOSIS — M6281 Muscle weakness (generalized): Secondary | ICD-10-CM | POA: Diagnosis not present

## 2019-06-21 DIAGNOSIS — M25562 Pain in left knee: Secondary | ICD-10-CM | POA: Diagnosis not present

## 2019-06-21 NOTE — Therapy (Signed)
[facility] [place], [place], [address] Phone: [phone]   Fax:  [phone]  Physical Therapy Treatment  Patient Details  Name: Carol Hoover MRN: [id number] Date of Birth: 1950-08-23 Referring Provider (PT): Gaynelle Arabian MD   Encounter Date: 06/21/2019  PT End of Session - 06/21/19 1220    Visit Number  13    Number of Visits  16    Date for PT Re-Evaluation  07/18/19    Authorization Type  FOTO.    PT Start Time  1115    PT Stop Time  1211    PT Time Calculation (min)  56 min       No past medical history on file.  Past Surgical History:  Procedure Laterality Date  . TUBAL LIGATION      There were no vitals filed for this visit.  Subjective Assessment - 06/21/19 1159    Subjective  COVID-19 screen performed prior to patient entering clinic.  Going to doctor next week.  Inside part of my knee wakes me up with an aching pain.    Pertinent History  Previous left knee scope,    How long can you walk comfortably?  Around house with [place].    Patient Stated Goals  Hike again.    Currently in Pain?  Yes    Pain Score  1     Pain Location  Knee    Pain Orientation  Left    Pain Descriptors / Indicators  Discomfort    Pain Onset  More than a month ago                       [facility] Adult PT Treatment/Exercise - 06/21/19 0001      Exercises   Exercises  Knee/Hip      Knee/Hip Exercises: Aerobic   Recumbent Bike  15 minutes moving to seat #1.      Knee/Hip Exercises: Standing   Other Standing Knee Exercises  Rockerboard x 4 minutes.      Acupuncturist Location  Left knee.   Medial joint line region.   Electrical Stimulation Action  Pre-mod.     Electrical Stimulation Parameters  80-150 Hz x 15 minutes.    Electrical Stimulation Goals  Edema;Pain      Vasopneumatic   Number Minutes Vasopneumatic   15 minutes    Vasopnuematic Location   --   Left knee.   Vasopneumatic Pressure  Medium      Manual Therapy   Manual Therapy  Soft tissue mobilization;Passive ROM    Soft tissue mobilization  IASTM x 5 minutes to patient's right medial knee region.    Passive ROM  PROM x 3 minutes into left knee extension.               PT Short Term Goals - 05/23/19 1414      PT SHORT TERM GOAL #1   Title  STG's=LTG's.        PT Long Term Goals - 06/14/19 1215      PT LONG TERM GOAL #1   Title  Independent with a HEP.    Time  8    Period  Weeks    Status  On-going      PT LONG TERM GOAL #2   Title  Active left knee flexion to 115 degrees+ so the patient can perform functional tasks and do so with pain not > 2-3/10  Time  8    Period  Weeks    Status  Achieved      PT LONG TERM GOAL #3   Title  Full active knee extension in order to normalize gait.    Time  8    Period  Weeks    Status  On-going      PT LONG TERM GOAL #4   Title  Increase left knee strength to a solid 4+/5 to provide good stability for accomplishment of functional activities.    Time  8    Period  Weeks    Status  On-going      PT LONG TERM GOAL #5   Title  Perform a reciprocating stair gait with one railing with pain not > 2-3/10.    Time  8    Period  Weeks    Status  On-going      PT LONG TERM GOAL #6   Title  Walk 500 feet in clinic without deviation and no assisitve device.    Time  8    Period  Weeks    Status  On-going            Plan - 06/21/19 1217    Clinical Impression Statement  Patient doing very well.  Her FOTO score improved to better than predicted.  She does have medial joint line pain that wakes her at night.    Personal Factors and Comorbidities  Comorbidity 1    Comorbidities  Previous left knee scope    Examination-Activity Limitations  Other;Locomotion Level;Stairs;Sit    Examination-Participation Restrictions  Other    Stability/Clinical Decision Making  Stable/Uncomplicated    Rehab Potential  Excellent    PT  Treatment/Interventions  ADLs/Self Care Home Management;Cryotherapy;Electrical Stimulation;Moist Heat;Gait training;Stair training;Functional mobility training;Therapeutic activities;Therapeutic exercise;Neuromuscular re-education;Manual techniques;Patient/family education;Passive range of motion;Vasopneumatic Device    PT Next Visit Plan  Nustep with progression to bike, PROM.  Achieve full extension early. VMS to left quads.  Vasopneumatic beginning on low.    Consulted and Agree with Plan of Care  Patient       Patient will benefit from skilled therapeutic intervention in order to improve the following deficits and impairments:  Abnormal gait, Difficulty walking, Pain, Increased edema, Decreased strength, Decreased activity tolerance, Decreased range of motion  Visit Diagnosis: Chronic pain of left knee  Stiffness of left knee, not elsewhere classified  Localized edema  Muscle weakness (generalized)     Problem List Patient Active Problem List   Diagnosis Date Noted  . Osteoarthritis of knee 12/19/2018  . History of deep vein thrombosis 12/19/2018  . Acute deep vein thrombosis (DVT) of popliteal vein of left lower extremity ([place]) 04/25/2018  . Acute deep vein thrombosis (DVT) of left peroneal vein ([place]) 04/25/2018  . Well female exam with routine gynecological exam 03/22/2016    Elner Seifert, [place] MPT 06/21/2019, 12:36 PM  [facility] [address] [place], [place], [address] Phone: [phone]   Fax:  [phone]  Name: Carol Hoover MRN: [id number] Date of Birth: 21-Aug-1950

## 2019-06-24 ENCOUNTER — Encounter: Payer: Self-pay | Admitting: Physical Therapy

## 2019-06-24 ENCOUNTER — Other Ambulatory Visit: Payer: Self-pay

## 2019-06-24 ENCOUNTER — Ambulatory Visit: Payer: Medicare HMO | Admitting: Physical Therapy

## 2019-06-24 DIAGNOSIS — M6281 Muscle weakness (generalized): Secondary | ICD-10-CM | POA: Diagnosis not present

## 2019-06-24 DIAGNOSIS — G8929 Other chronic pain: Secondary | ICD-10-CM

## 2019-06-24 DIAGNOSIS — M25662 Stiffness of left knee, not elsewhere classified: Secondary | ICD-10-CM

## 2019-06-24 DIAGNOSIS — M25562 Pain in left knee: Secondary | ICD-10-CM | POA: Diagnosis not present

## 2019-06-24 DIAGNOSIS — R600 Localized edema: Secondary | ICD-10-CM

## 2019-06-24 NOTE — Therapy (Signed)
[facility] [place], [place], [address] Phone: [phone]   Fax:  [phone]  Physical Therapy Treatment  Patient Details  Name: Carol Hoover MRN: [id number] Date of Birth: 05-Oct-1950 Referring Provider (PT): Gaynelle Arabian MD   Encounter Date: 06/24/2019  PT End of Session - 06/24/19 1436    Visit Number  14    Number of Visits  16    Date for PT Re-Evaluation  07/18/19    Authorization Type  FOTO 13th visit 40%    PT Start Time  0230    PT Stop Time  0314    PT Time Calculation (min)  44 min    Activity Tolerance  Patient tolerated treatment well    Behavior During Therapy  [facility] for tasks assessed/performed       History reviewed. No pertinent past medical history.  Past Surgical History:  Procedure Laterality Date  . TUBAL LIGATION      There were no vitals filed for this visit.  Subjective Assessment - 06/24/19 1431    Subjective  COVID-19 screen performed prior to patient entering clinic.  Patient arrived with little discomfort    Pertinent History  Previous left knee scope,    How long can you walk comfortably?  Around house with [place].    Patient Stated Goals  Hike again.    Currently in Pain?  Yes    Pain Score  1     Pain Location  Knee    Pain Orientation  Left    Pain Descriptors / Indicators  Discomfort    Pain Type  Surgical pain    Pain Onset  More than a month ago    Pain Frequency  Constant    Aggravating Factors   prolong walking/activity    Pain Relieving Factors  at rest         [facility] PT Assessment - 06/24/19 0001      ROM / Strength   AROM / PROM / Strength  AROM;PROM;Strength      AROM   AROM Assessment Site  Knee    Right/Left Knee  Left    Left Knee Extension  -3    Left Knee Flexion  115      PROM   PROM Assessment Site  Knee    Right/Left Knee  Left    Left Knee Extension  0      Strength   Strength Assessment Site  Knee;Hip    Right/Left Hip  Left    Left Hip  Flexion  4+/5    Left Hip ABduction  4/5    Right/Left Knee  Left    Left Knee Flexion  4+/5    Left Knee Extension  4/5                   [facility] Adult PT Treatment/Exercise - 06/24/19 0001      Knee/Hip Exercises: Aerobic   Recumbent Bike  13 minutes moving to seat #1.      Knee/Hip Exercises: Standing   Forward Step Up  Left;2 sets;10 reps;Step Height: 4";Hand Hold: 0    Step Down  Left;2 sets;10 reps;Hand Hold: 0;Step Height: 4"    Rocker Board  4 minutes      Electrical Stimulation   Electrical Stimulation Location  Left knee.    Electrical Stimulation Action  premod    Electrical Stimulation Parameters  80-_0  x68mn    Electrical Stimulation Goals  Edema;Pain  Vasopneumatic   Number Minutes Vasopneumatic   10 minutes    Vasopnuematic Location   Knee    Vasopneumatic Pressure  Medium    Vasopneumatic Temperature   36 for edema      Manual Therapy   Manual Therapy  Passive ROM    Passive ROM  manual PROM for left knee ext with holds to improve mobility               PT Short Term Goals - 05/23/19 1414      PT SHORT TERM GOAL #1   Title  STG's=LTG's.        PT Long Term Goals - 06/24/19 1433      PT LONG TERM GOAL #1   Title  Independent with a HEP.    Time  8    Period  Weeks    Status  On-going      PT LONG TERM GOAL #2   Title  Active left knee flexion to 115 degrees+ so the patient can perform functional tasks and do so with pain not > 2-3/10    Period  Weeks    Status  Achieved      PT LONG TERM GOAL #3   Title  Full active knee extension in order to normalize gait.    Time  8    Period  Weeks    Status  On-going   AROM -3 degrees     PT LONG TERM GOAL #4   Title  Increase left knee strength to a solid 4+/5 to provide good stability for accomplishment of functional activities.    Time  8    Period  Weeks    Status  Partially Met      PT LONG TERM GOAL #5   Title  Perform a reciprocating stair gait with one railing  with pain not > 2-3/10.    Baseline  Met 06/24/19    Time  8    Period  Weeks    Status  Achieved      PT LONG TERM GOAL #6   Title  Walk 500 feet in clinic without deviation and no assisitve device.    Time  8    Period  Weeks    Status  Partially Met   walking without assit device yet slight deviation 06/24/19           Plan - 06/24/19 1506    Clinical Impression Statement  Patient tolerated treatment well today. Patient is able to perform all ADL's with no difficulty. Patient would like to perorm her hobby of hiking when able. Patient has overall improved with knee ROM and strength. Patient has some ongoing limitations with edema after prolong standing/walking. Minimal strength defict. Patient going to MD for F/U. LTG # met others ongoing.    Personal Factors and Comorbidities  Comorbidity 1    Comorbidities  Previous left knee scope    Examination-Activity Limitations  Other;Locomotion Level;Stairs;Sit    Examination-Participation Restrictions  Other    Stability/Clinical Decision Making  Stable/Uncomplicated    Rehab Potential  Excellent    PT Treatment/Interventions  ADLs/Self Care Home Management;Cryotherapy;Electrical Stimulation;Moist Heat;Gait training;Stair training;Functional mobility training;Therapeutic activities;Therapeutic exercise;Neuromuscular re-education;Manual techniques;Patient/family education;Passive range of motion;Vasopneumatic Device    PT Next Visit Plan  MD F/U then proceed per recommendation    Consulted and Agree with Plan of Care  Patient       Patient will benefit from skilled therapeutic intervention in order to improve the  following deficits and impairments:  Abnormal gait, Difficulty walking, Pain, Increased edema, Decreased strength, Decreased activity tolerance, Decreased range of motion  Visit Diagnosis: Chronic pain of left knee  Stiffness of left knee, not elsewhere classified  Localized edema  Muscle weakness  (generalized)     Problem List Patient Active Problem List   Diagnosis Date Noted  . Osteoarthritis of knee 12/19/2018  . History of deep vein thrombosis 12/19/2018  . Acute deep vein thrombosis (DVT) of popliteal vein of left lower extremity ([place]) 04/25/2018  . Acute deep vein thrombosis (DVT) of left peroneal vein ([place]) 04/25/2018  . Well female exam with routine gynecological exam 03/22/2016    Ladean Raya, PTA 06/24/19 3:16 PM  [facility] [place], [place], [address] Phone: [phone]   Fax:  [phone]  Name: Carol Hoover MRN: [id number] Date of Birth: 12/31/1950

## 2019-06-25 DIAGNOSIS — Z96652 Presence of left artificial knee joint: Secondary | ICD-10-CM | POA: Diagnosis not present

## 2019-06-25 DIAGNOSIS — Z471 Aftercare following joint replacement surgery: Secondary | ICD-10-CM | POA: Diagnosis not present

## 2019-06-26 ENCOUNTER — Ambulatory Visit: Payer: Medicare HMO | Admitting: Physical Therapy

## 2019-06-28 ENCOUNTER — Encounter: Payer: Medicare HMO | Admitting: Physical Therapy

## 2019-07-15 DIAGNOSIS — M179 Osteoarthritis of knee, unspecified: Secondary | ICD-10-CM | POA: Diagnosis not present

## 2019-07-15 DIAGNOSIS — M791 Myalgia, unspecified site: Secondary | ICD-10-CM | POA: Diagnosis not present

## 2019-08-26 DIAGNOSIS — M791 Myalgia, unspecified site: Secondary | ICD-10-CM | POA: Diagnosis not present

## 2019-08-29 DIAGNOSIS — R69 Illness, unspecified: Secondary | ICD-10-CM | POA: Diagnosis not present

## 2019-09-02 DIAGNOSIS — N951 Menopausal and female climacteric states: Secondary | ICD-10-CM | POA: Diagnosis not present

## 2019-09-02 DIAGNOSIS — R232 Flushing: Secondary | ICD-10-CM | POA: Diagnosis not present

## 2019-09-02 DIAGNOSIS — R69 Illness, unspecified: Secondary | ICD-10-CM | POA: Diagnosis not present

## 2019-09-04 DIAGNOSIS — N898 Other specified noninflammatory disorders of vagina: Secondary | ICD-10-CM | POA: Diagnosis not present

## 2019-09-04 DIAGNOSIS — N951 Menopausal and female climacteric states: Secondary | ICD-10-CM | POA: Diagnosis not present

## 2019-09-04 DIAGNOSIS — R232 Flushing: Secondary | ICD-10-CM | POA: Diagnosis not present

## 2019-09-04 DIAGNOSIS — R69 Illness, unspecified: Secondary | ICD-10-CM | POA: Diagnosis not present

## 2019-10-21 DIAGNOSIS — M65331 Trigger finger, right middle finger: Secondary | ICD-10-CM | POA: Diagnosis not present

## 2019-10-21 DIAGNOSIS — M79644 Pain in right finger(s): Secondary | ICD-10-CM | POA: Diagnosis not present

## 2019-11-16 IMAGING — US US ABDOMEN LIMITED
1 series · 14 of 25 positions shown · non-contrast
Comparison: None.

CLINICAL DATA: Nausea and epigastric pain for the past 1-2 months.

EXAM:
ULTRASOUND ABDOMEN LIMITED RIGHT UPPER QUADRANT

[Series 1: us abdomen limited · 14 of 55 slices shown]
[im 1/55]
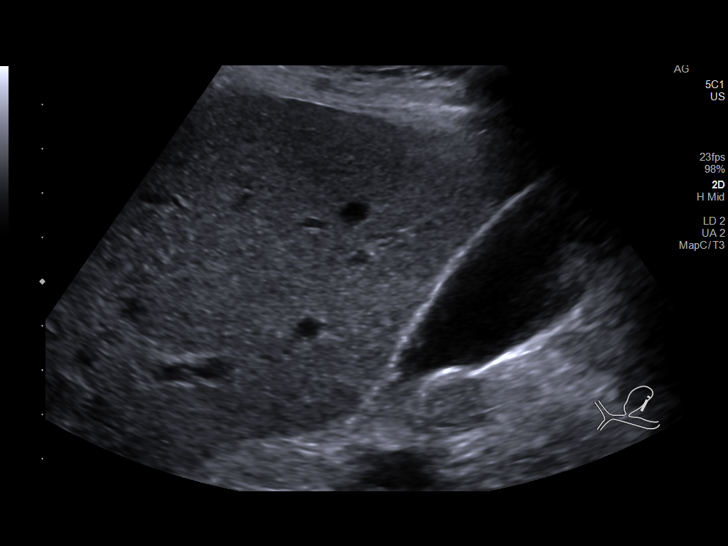
[im 5/55]
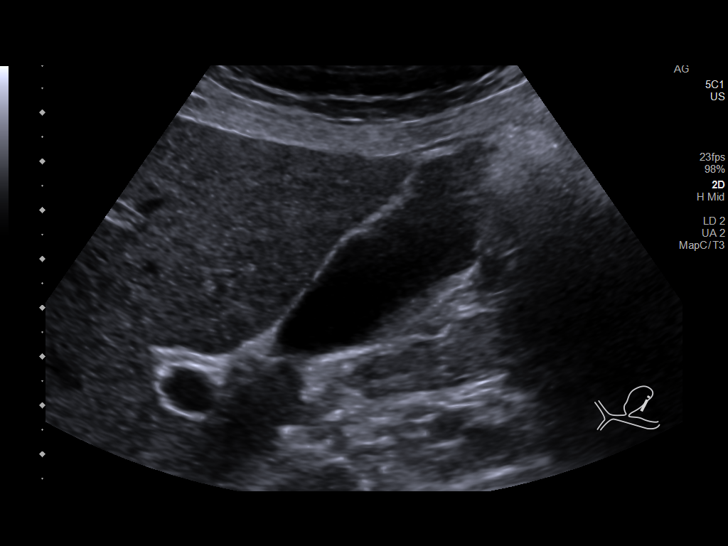
[im 10/55]
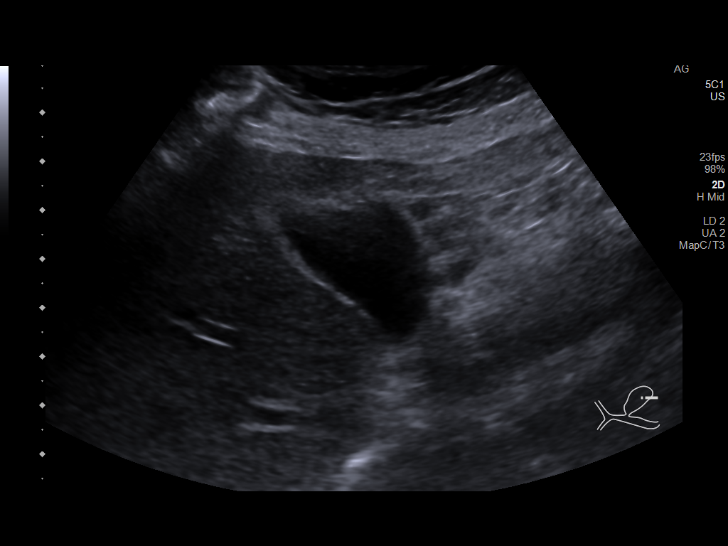
[im 14/55]
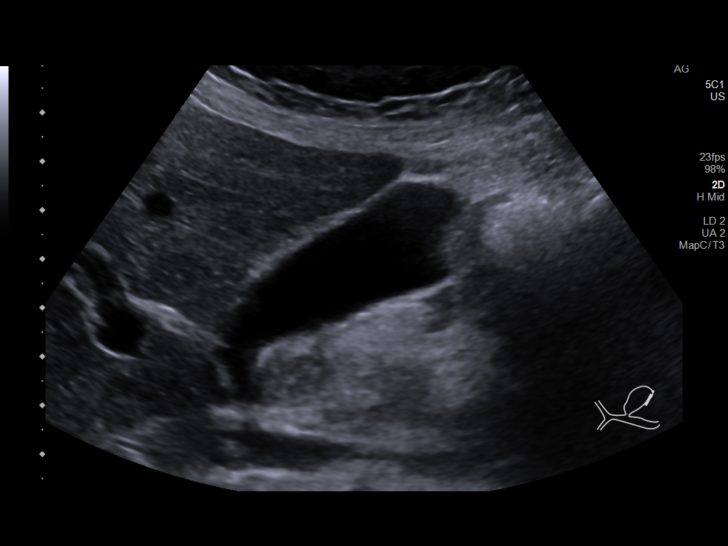
[im 19/55]
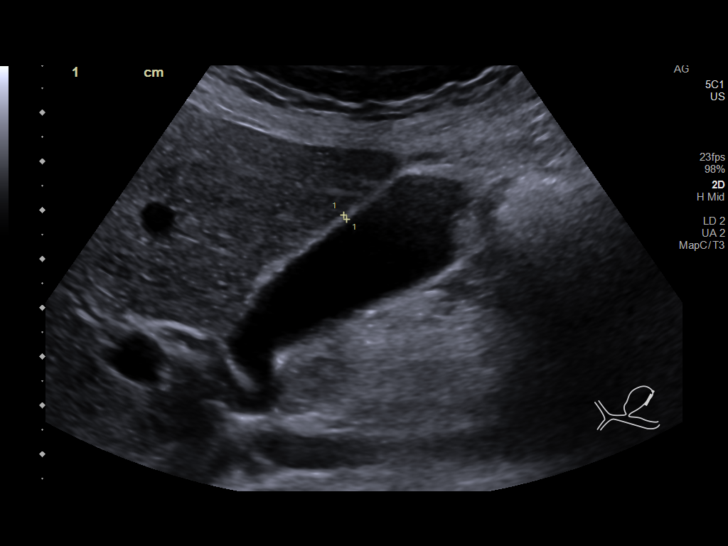
[im 21/55]
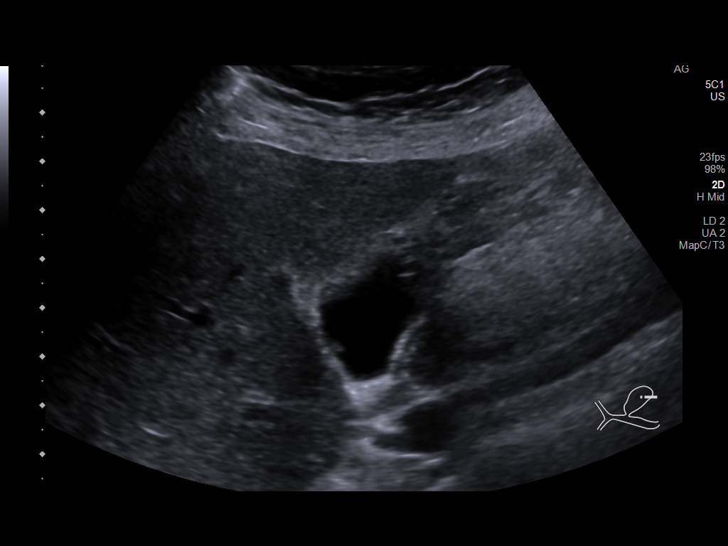
[im 25/55]
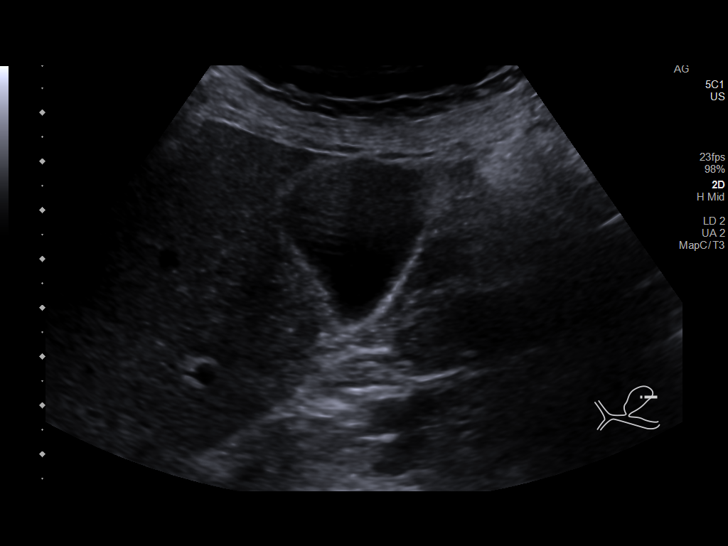
[im 30/55]
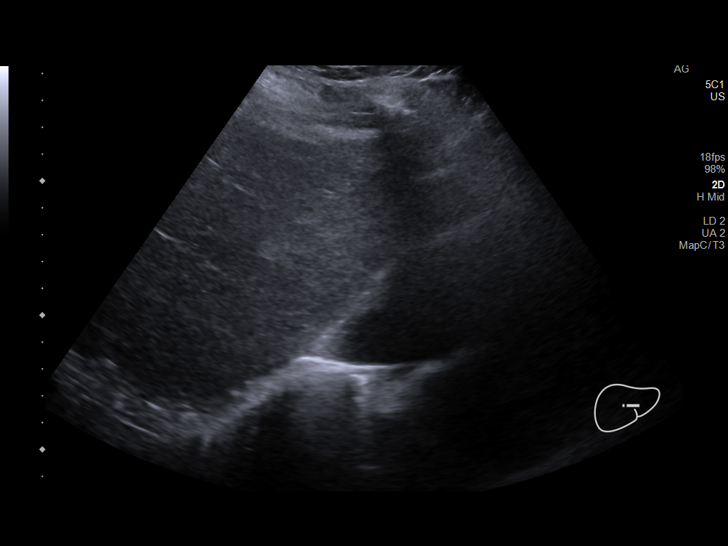
[im 34/55]
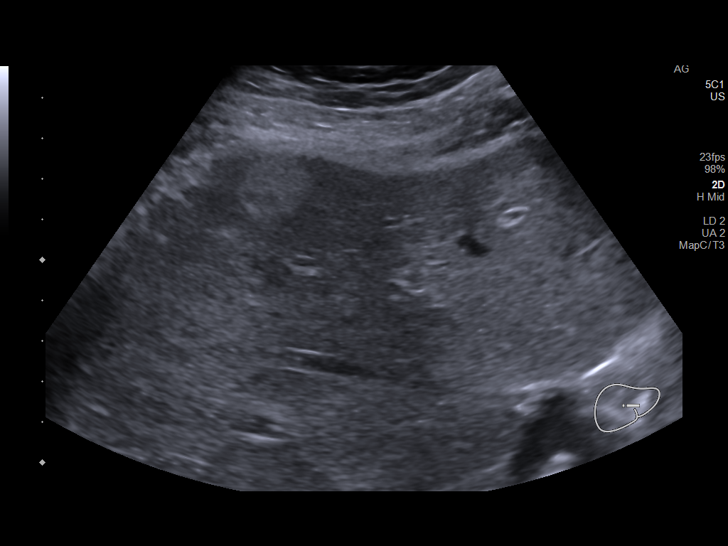
[im 37/55]
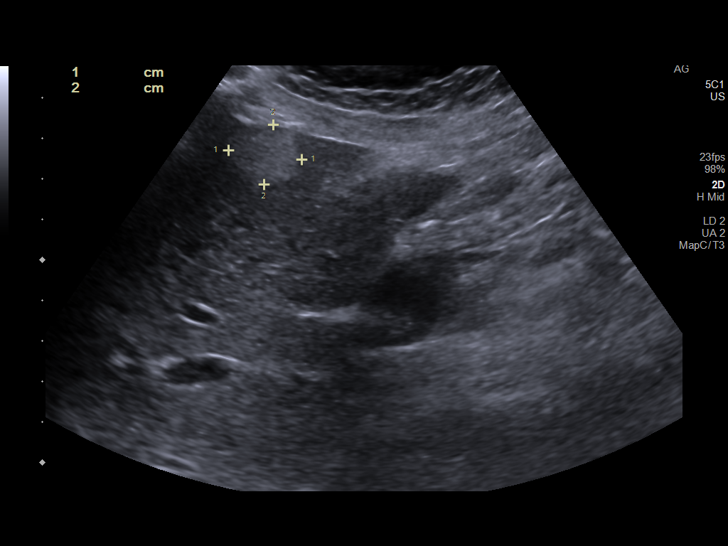
[im 41/55]
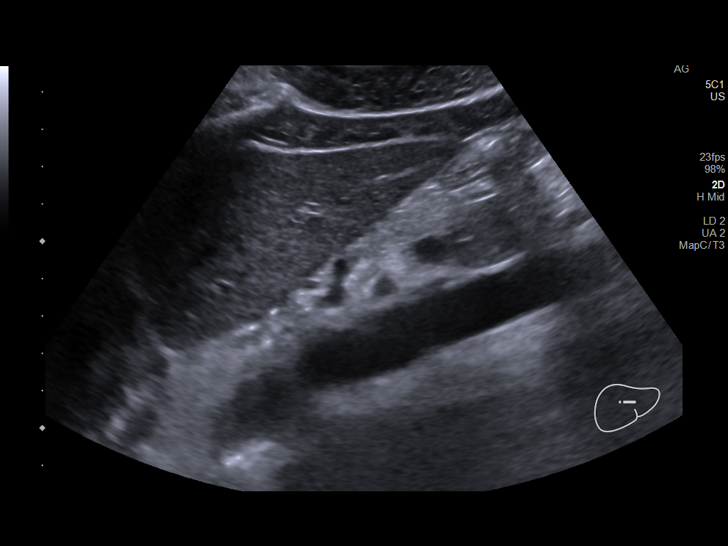
[im 46/55]
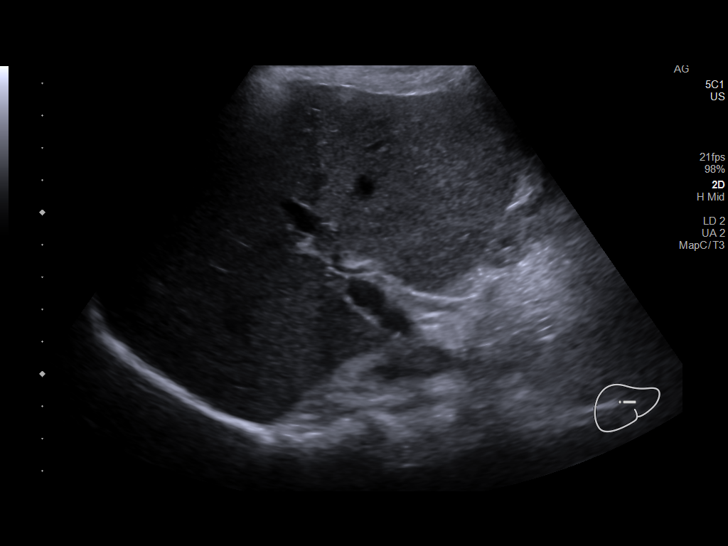
[im 50/55]
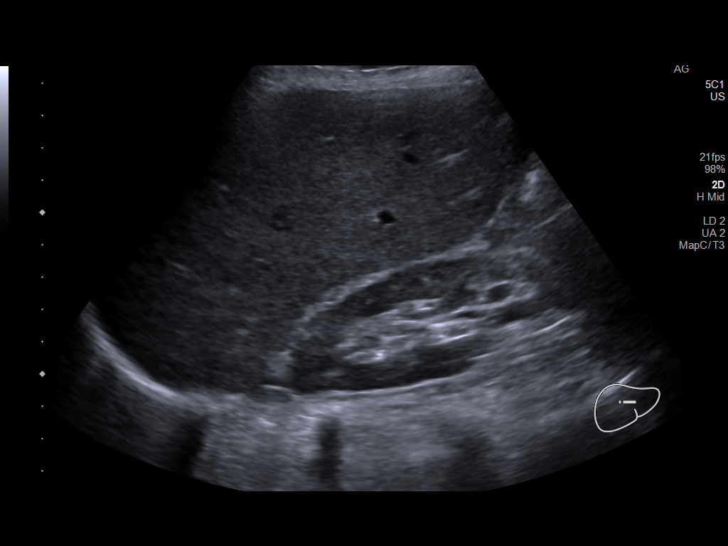
[im 55/55]
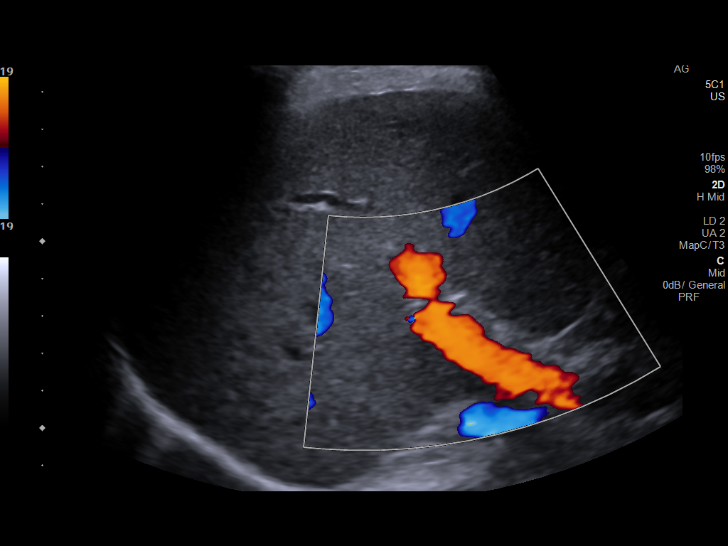

[14 of 25 positions shown; findings below may reference images not displayed]

FINDINGS: Gallbladder:

No gallstones or wall thickening visualized. 2 mm polyp. No
sonographic Murphy sign noted by sonographer.

Common bile duct:

Diameter: 1-2 mm, normal.

Liver:

1.8 x 1.5 x 1.9 cm round hyperechoic lesion in the left hepatic
lobe. Within normal limits in parenchymal echogenicity. Portal vein
is patent on color Doppler imaging with normal direction of blood
flow towards the liver.

Other: None.
IMPRESSION: 1. No acute abnormality.
2. 1.9 cm hyperechoic lesion in the left lobe of the liver, probably
a hemangioma. However, given its size, MRI abdomen without and with
contrast is recommended for definitive characterization.
3. 2 mm gallbladder polyp. No further follow-up required. This
recommendation follows ACR consensus guidelines: White Paper of the
ACR Incidental Findings Committee II on Gallbladder and Biliary
Findings. [HOSPITAL] 4637:;[DATE].

## 2019-12-02 DIAGNOSIS — R69 Illness, unspecified: Secondary | ICD-10-CM | POA: Diagnosis not present

## 2019-12-02 DIAGNOSIS — N898 Other specified noninflammatory disorders of vagina: Secondary | ICD-10-CM | POA: Diagnosis not present

## 2019-12-02 DIAGNOSIS — R232 Flushing: Secondary | ICD-10-CM | POA: Diagnosis not present

## 2019-12-02 DIAGNOSIS — N951 Menopausal and female climacteric states: Secondary | ICD-10-CM | POA: Diagnosis not present

## 2019-12-04 DIAGNOSIS — R232 Flushing: Secondary | ICD-10-CM | POA: Diagnosis not present

## 2019-12-04 DIAGNOSIS — Z6822 Body mass index (BMI) 22.0-22.9, adult: Secondary | ICD-10-CM | POA: Diagnosis not present

## 2019-12-04 DIAGNOSIS — Z86718 Personal history of other venous thrombosis and embolism: Secondary | ICD-10-CM | POA: Diagnosis not present

## 2019-12-04 DIAGNOSIS — G479 Sleep disorder, unspecified: Secondary | ICD-10-CM | POA: Diagnosis not present

## 2019-12-04 DIAGNOSIS — N898 Other specified noninflammatory disorders of vagina: Secondary | ICD-10-CM | POA: Diagnosis not present

## 2019-12-04 DIAGNOSIS — R69 Illness, unspecified: Secondary | ICD-10-CM | POA: Diagnosis not present

## 2019-12-04 DIAGNOSIS — N951 Menopausal and female climacteric states: Secondary | ICD-10-CM | POA: Diagnosis not present

## 2020-03-02 DIAGNOSIS — N951 Menopausal and female climacteric states: Secondary | ICD-10-CM | POA: Diagnosis not present

## 2020-03-04 DIAGNOSIS — N898 Other specified noninflammatory disorders of vagina: Secondary | ICD-10-CM | POA: Diagnosis not present

## 2020-03-04 DIAGNOSIS — N951 Menopausal and female climacteric states: Secondary | ICD-10-CM | POA: Diagnosis not present

## 2020-03-04 DIAGNOSIS — R232 Flushing: Secondary | ICD-10-CM | POA: Diagnosis not present

## 2020-03-07 IMAGING — DX DG CHEST 2V
2 series · 2 of 2 positions shown · non-contrast
Comparison: None.

CLINICAL DATA: Preoperative assessment

EXAM:
CHEST - 2 VIEW

[chest pa]
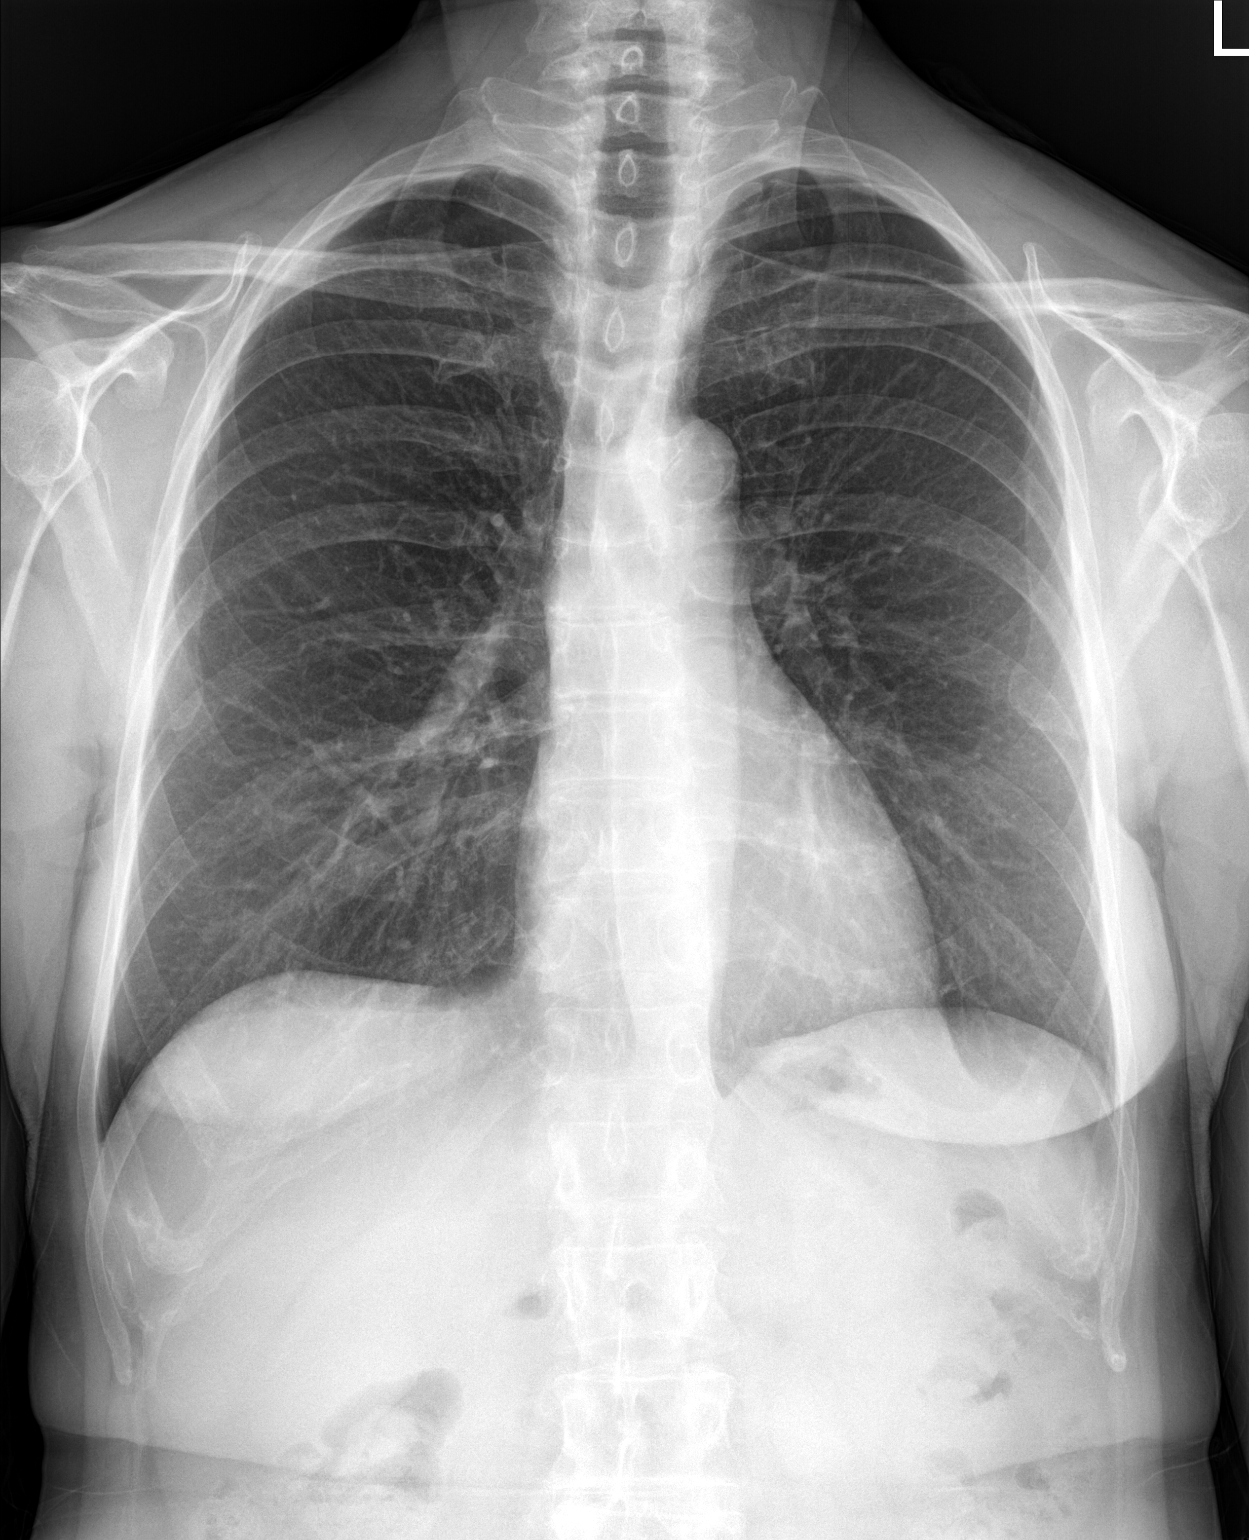

[chest lat]
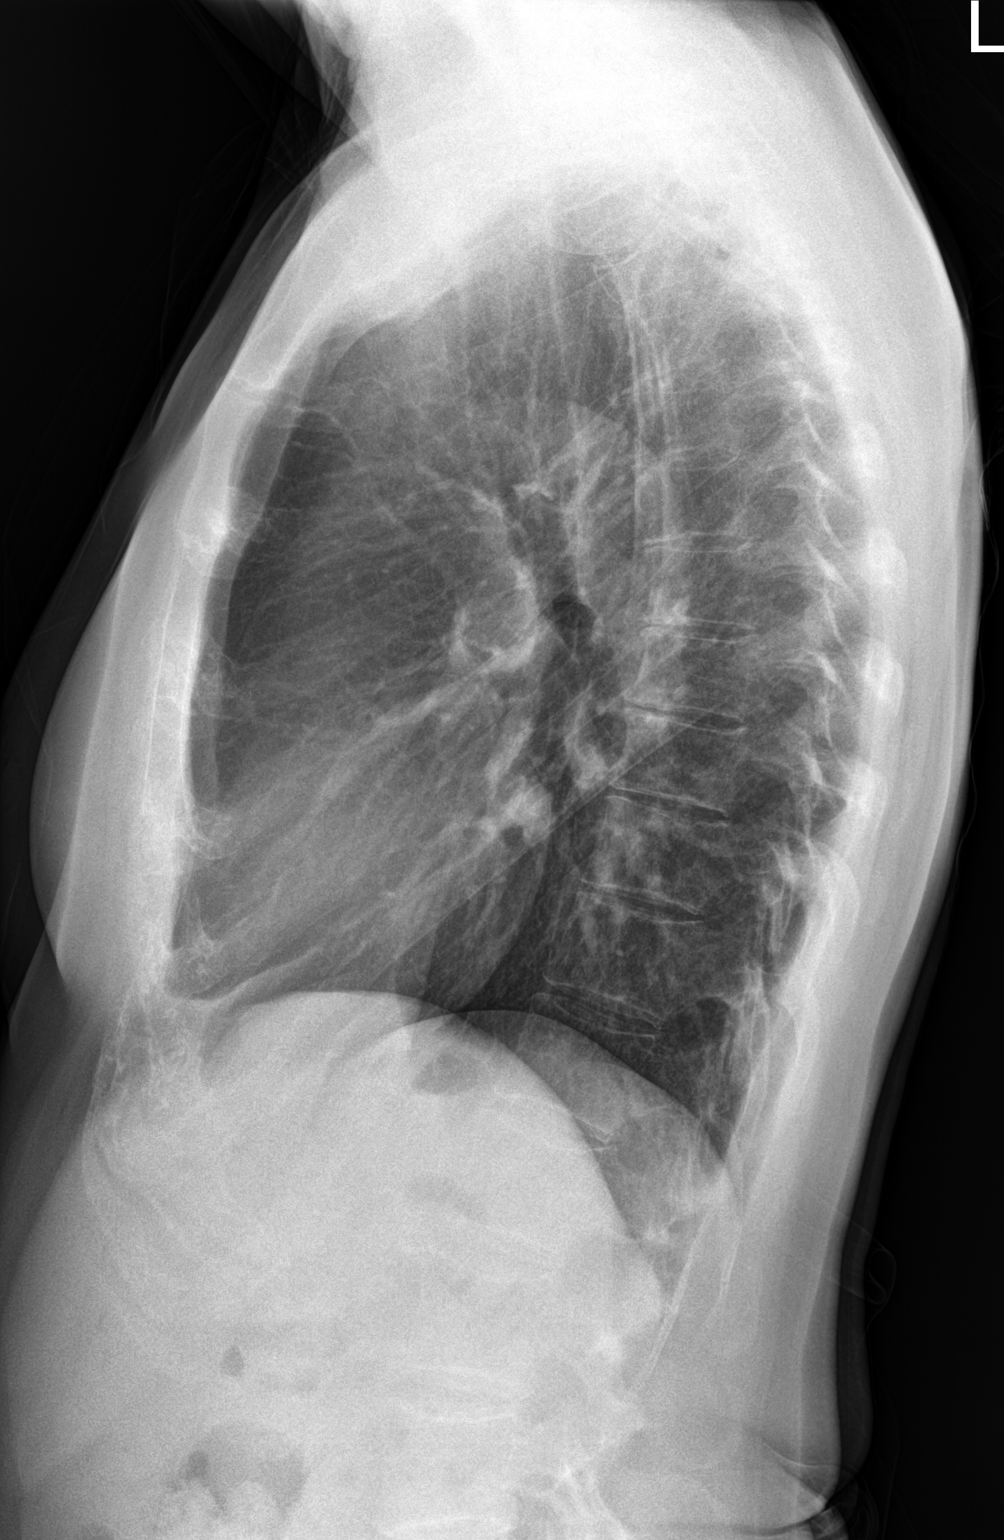

[2 of 2 positions shown; findings below may reference images not displayed]

FINDINGS: Lungs are clear. Heart size and pulmonary vascularity are normal. No
adenopathy. There is aortic atherosclerosis. No bone lesions.
IMPRESSION: Lungs clear. Cardiac silhouette normal. Aortic Atherosclerosis
([id number]).

## 2020-03-16 DIAGNOSIS — M791 Myalgia, unspecified site: Secondary | ICD-10-CM | POA: Diagnosis not present

## 2020-03-16 DIAGNOSIS — M79642 Pain in left hand: Secondary | ICD-10-CM | POA: Diagnosis not present

## 2020-03-16 DIAGNOSIS — M1990 Unspecified osteoarthritis, unspecified site: Secondary | ICD-10-CM | POA: Diagnosis not present

## 2020-04-03 DIAGNOSIS — Z0100 Encounter for examination of eyes and vision without abnormal findings: Secondary | ICD-10-CM | POA: Diagnosis not present

## 2020-04-03 DIAGNOSIS — H5203 Hypermetropia, bilateral: Secondary | ICD-10-CM | POA: Diagnosis not present

## 2020-04-27 DIAGNOSIS — L218 Other seborrheic dermatitis: Secondary | ICD-10-CM | POA: Diagnosis not present

## 2020-04-27 DIAGNOSIS — L578 Other skin changes due to chronic exposure to nonionizing radiation: Secondary | ICD-10-CM | POA: Diagnosis not present

## 2020-04-27 DIAGNOSIS — L700 Acne vulgaris: Secondary | ICD-10-CM | POA: Diagnosis not present

## 2020-04-27 DIAGNOSIS — L820 Inflamed seborrheic keratosis: Secondary | ICD-10-CM | POA: Diagnosis not present

## 2020-04-27 DIAGNOSIS — D225 Melanocytic nevi of trunk: Secondary | ICD-10-CM | POA: Diagnosis not present

## 2020-04-30 DIAGNOSIS — M65331 Trigger finger, right middle finger: Secondary | ICD-10-CM | POA: Diagnosis not present

## 2020-04-30 DIAGNOSIS — M13842 Other specified arthritis, left hand: Secondary | ICD-10-CM | POA: Diagnosis not present

## 2020-05-01 ENCOUNTER — Encounter: Payer: Self-pay | Admitting: Family Medicine

## 2020-05-05 ENCOUNTER — Encounter: Payer: Medicare HMO | Admitting: Family Medicine

## 2020-05-05 ENCOUNTER — Other Ambulatory Visit: Payer: Self-pay

## 2020-05-14 DIAGNOSIS — Z79891 Long term (current) use of opiate analgesic: Secondary | ICD-10-CM | POA: Diagnosis not present

## 2020-05-25 DIAGNOSIS — Z0000 Encounter for general adult medical examination without abnormal findings: Secondary | ICD-10-CM | POA: Diagnosis not present

## 2020-05-25 DIAGNOSIS — K769 Liver disease, unspecified: Secondary | ICD-10-CM | POA: Diagnosis not present

## 2020-05-25 DIAGNOSIS — E559 Vitamin D deficiency, unspecified: Secondary | ICD-10-CM | POA: Diagnosis not present

## 2020-05-25 DIAGNOSIS — Z0001 Encounter for general adult medical examination with abnormal findings: Secondary | ICD-10-CM | POA: Diagnosis not present

## 2020-05-25 DIAGNOSIS — D1809 Hemangioma of other sites: Secondary | ICD-10-CM | POA: Diagnosis not present

## 2020-05-25 DIAGNOSIS — Z13220 Encounter for screening for lipoid disorders: Secondary | ICD-10-CM | POA: Diagnosis not present

## 2020-05-25 DIAGNOSIS — H6121 Impacted cerumen, right ear: Secondary | ICD-10-CM | POA: Diagnosis not present

## 2020-05-28 DIAGNOSIS — M25561 Pain in right knee: Secondary | ICD-10-CM | POA: Diagnosis not present

## 2020-05-28 DIAGNOSIS — Z96652 Presence of left artificial knee joint: Secondary | ICD-10-CM | POA: Diagnosis not present

## 2020-06-01 DIAGNOSIS — D1803 Hemangioma of intra-abdominal structures: Secondary | ICD-10-CM | POA: Diagnosis not present

## 2020-06-01 DIAGNOSIS — N951 Menopausal and female climacteric states: Secondary | ICD-10-CM | POA: Diagnosis not present

## 2020-06-01 DIAGNOSIS — K769 Liver disease, unspecified: Secondary | ICD-10-CM | POA: Diagnosis not present

## 2020-06-02 ENCOUNTER — Other Ambulatory Visit: Payer: Self-pay | Admitting: Family

## 2020-06-02 DIAGNOSIS — Z1231 Encounter for screening mammogram for malignant neoplasm of breast: Secondary | ICD-10-CM

## 2020-06-03 ENCOUNTER — Ambulatory Visit
Admission: RE | Admit: 2020-06-03 | Discharge: 2020-06-03 | Disposition: A | Discharge status: Home / Self Care | Payer: Medicare HMO | Source: Ambulatory Visit | Attending: Family | Admitting: Family

## 2020-06-03 ENCOUNTER — Other Ambulatory Visit: Payer: Self-pay

## 2020-06-03 DIAGNOSIS — N951 Menopausal and female climacteric states: Secondary | ICD-10-CM | POA: Diagnosis not present

## 2020-06-03 DIAGNOSIS — Z1231 Encounter for screening mammogram for malignant neoplasm of breast: Secondary | ICD-10-CM

## 2020-06-03 DIAGNOSIS — R232 Flushing: Secondary | ICD-10-CM | POA: Diagnosis not present

## 2020-06-03 DIAGNOSIS — G479 Sleep disorder, unspecified: Secondary | ICD-10-CM | POA: Diagnosis not present

## 2020-06-03 DIAGNOSIS — R69 Illness, unspecified: Secondary | ICD-10-CM | POA: Diagnosis not present

## 2020-06-03 DIAGNOSIS — N898 Other specified noninflammatory disorders of vagina: Secondary | ICD-10-CM | POA: Diagnosis not present

## 2020-06-03 DIAGNOSIS — Z6822 Body mass index (BMI) 22.0-22.9, adult: Secondary | ICD-10-CM | POA: Diagnosis not present

## 2020-06-03 DIAGNOSIS — Z86718 Personal history of other venous thrombosis and embolism: Secondary | ICD-10-CM | POA: Diagnosis not present

## 2020-06-15 DIAGNOSIS — Z01419 Encounter for gynecological examination (general) (routine) without abnormal findings: Secondary | ICD-10-CM | POA: Diagnosis not present

## 2020-08-28 DIAGNOSIS — N951 Menopausal and female climacteric states: Secondary | ICD-10-CM | POA: Diagnosis not present

## 2020-09-02 DIAGNOSIS — R232 Flushing: Secondary | ICD-10-CM | POA: Diagnosis not present

## 2020-09-02 DIAGNOSIS — R69 Illness, unspecified: Secondary | ICD-10-CM | POA: Diagnosis not present

## 2020-09-02 DIAGNOSIS — N898 Other specified noninflammatory disorders of vagina: Secondary | ICD-10-CM | POA: Diagnosis not present

## 2020-09-02 DIAGNOSIS — Z86718 Personal history of other venous thrombosis and embolism: Secondary | ICD-10-CM | POA: Diagnosis not present

## 2020-09-02 DIAGNOSIS — Z6822 Body mass index (BMI) 22.0-22.9, adult: Secondary | ICD-10-CM | POA: Diagnosis not present

## 2020-09-02 DIAGNOSIS — N951 Menopausal and female climacteric states: Secondary | ICD-10-CM | POA: Diagnosis not present

## 2020-09-09 DIAGNOSIS — Z7689 Persons encountering health services in other specified circumstances: Secondary | ICD-10-CM | POA: Diagnosis not present

## 2020-09-16 DIAGNOSIS — M25552 Pain in left hip: Secondary | ICD-10-CM | POA: Diagnosis not present

## 2020-10-23 DIAGNOSIS — M25552 Pain in left hip: Secondary | ICD-10-CM | POA: Diagnosis not present

## 2020-10-23 DIAGNOSIS — M25561 Pain in right knee: Secondary | ICD-10-CM | POA: Diagnosis not present

## 2020-11-30 DIAGNOSIS — N951 Menopausal and female climacteric states: Secondary | ICD-10-CM | POA: Diagnosis not present

## 2020-12-02 DIAGNOSIS — Z6821 Body mass index (BMI) 21.0-21.9, adult: Secondary | ICD-10-CM | POA: Diagnosis not present

## 2020-12-02 DIAGNOSIS — R232 Flushing: Secondary | ICD-10-CM | POA: Diagnosis not present

## 2020-12-02 DIAGNOSIS — N951 Menopausal and female climacteric states: Secondary | ICD-10-CM | POA: Diagnosis not present

## 2020-12-02 DIAGNOSIS — R69 Illness, unspecified: Secondary | ICD-10-CM | POA: Diagnosis not present

## 2020-12-04 DIAGNOSIS — J0100 Acute maxillary sinusitis, unspecified: Secondary | ICD-10-CM | POA: Diagnosis not present

## 2020-12-04 DIAGNOSIS — Z87891 Personal history of nicotine dependence: Secondary | ICD-10-CM | POA: Diagnosis not present

## 2021-03-02 DIAGNOSIS — N951 Menopausal and female climacteric states: Secondary | ICD-10-CM | POA: Diagnosis not present

## 2021-03-04 DIAGNOSIS — Z6822 Body mass index (BMI) 22.0-22.9, adult: Secondary | ICD-10-CM | POA: Diagnosis not present

## 2021-03-04 DIAGNOSIS — N951 Menopausal and female climacteric states: Secondary | ICD-10-CM | POA: Diagnosis not present

## 2021-03-04 DIAGNOSIS — R232 Flushing: Secondary | ICD-10-CM | POA: Diagnosis not present

## 2021-03-04 DIAGNOSIS — G479 Sleep disorder, unspecified: Secondary | ICD-10-CM | POA: Diagnosis not present

## 2021-04-05 DIAGNOSIS — H353190 Nonexudative age-related macular degeneration, unspecified eye, stage unspecified: Secondary | ICD-10-CM | POA: Diagnosis not present

## 2021-04-05 DIAGNOSIS — H25813 Combined forms of age-related cataract, bilateral: Secondary | ICD-10-CM | POA: Diagnosis not present

## 2021-04-05 DIAGNOSIS — H5200 Hypermetropia, unspecified eye: Secondary | ICD-10-CM | POA: Diagnosis not present

## 2021-04-05 DIAGNOSIS — Z0100 Encounter for examination of eyes and vision without abnormal findings: Secondary | ICD-10-CM | POA: Diagnosis not present

## 2021-04-09 DIAGNOSIS — L738 Other specified follicular disorders: Secondary | ICD-10-CM | POA: Diagnosis not present

## 2021-04-09 DIAGNOSIS — Z08 Encounter for follow-up examination after completed treatment for malignant neoplasm: Secondary | ICD-10-CM | POA: Diagnosis not present

## 2021-04-09 DIAGNOSIS — L814 Other melanin hyperpigmentation: Secondary | ICD-10-CM | POA: Diagnosis not present

## 2021-04-09 DIAGNOSIS — D225 Melanocytic nevi of trunk: Secondary | ICD-10-CM | POA: Diagnosis not present

## 2021-04-09 DIAGNOSIS — L298 Other pruritus: Secondary | ICD-10-CM | POA: Diagnosis not present

## 2021-04-09 DIAGNOSIS — L821 Other seborrheic keratosis: Secondary | ICD-10-CM | POA: Diagnosis not present

## 2021-04-09 DIAGNOSIS — L820 Inflamed seborrheic keratosis: Secondary | ICD-10-CM | POA: Diagnosis not present

## 2021-04-09 DIAGNOSIS — L538 Other specified erythematous conditions: Secondary | ICD-10-CM | POA: Diagnosis not present

## 2021-04-09 DIAGNOSIS — Z85828 Personal history of other malignant neoplasm of skin: Secondary | ICD-10-CM | POA: Diagnosis not present

## 2021-04-13 ENCOUNTER — Other Ambulatory Visit: Payer: Self-pay | Admitting: Family

## 2021-04-13 DIAGNOSIS — Z1231 Encounter for screening mammogram for malignant neoplasm of breast: Secondary | ICD-10-CM

## 2021-04-23 DIAGNOSIS — J0140 Acute pansinusitis, unspecified: Secondary | ICD-10-CM | POA: Diagnosis not present

## 2021-05-17 DIAGNOSIS — Z0000 Encounter for general adult medical examination without abnormal findings: Secondary | ICD-10-CM | POA: Diagnosis not present

## 2021-05-17 DIAGNOSIS — H68023 Chronic Eustachian salpingitis, bilateral: Secondary | ICD-10-CM | POA: Diagnosis not present

## 2021-05-17 DIAGNOSIS — Z1211 Encounter for screening for malignant neoplasm of colon: Secondary | ICD-10-CM | POA: Diagnosis not present

## 2021-05-17 DIAGNOSIS — R030 Elevated blood-pressure reading, without diagnosis of hypertension: Secondary | ICD-10-CM | POA: Diagnosis not present

## 2021-05-17 DIAGNOSIS — Z1231 Encounter for screening mammogram for malignant neoplasm of breast: Secondary | ICD-10-CM | POA: Diagnosis not present

## 2021-05-17 DIAGNOSIS — Z136 Encounter for screening for cardiovascular disorders: Secondary | ICD-10-CM | POA: Diagnosis not present

## 2021-05-17 DIAGNOSIS — Z13820 Encounter for screening for osteoporosis: Secondary | ICD-10-CM | POA: Diagnosis not present

## 2021-05-20 DIAGNOSIS — R202 Paresthesia of skin: Secondary | ICD-10-CM | POA: Diagnosis not present

## 2021-05-20 DIAGNOSIS — M25561 Pain in right knee: Secondary | ICD-10-CM | POA: Diagnosis not present

## 2021-05-20 DIAGNOSIS — Z96652 Presence of left artificial knee joint: Secondary | ICD-10-CM | POA: Diagnosis not present

## 2021-05-21 DIAGNOSIS — J342 Deviated nasal septum: Secondary | ICD-10-CM | POA: Diagnosis not present

## 2021-05-21 DIAGNOSIS — R519 Headache, unspecified: Secondary | ICD-10-CM | POA: Diagnosis not present

## 2021-05-21 DIAGNOSIS — J343 Hypertrophy of nasal turbinates: Secondary | ICD-10-CM | POA: Diagnosis not present

## 2021-05-21 DIAGNOSIS — H6983 Other specified disorders of Eustachian tube, bilateral: Secondary | ICD-10-CM | POA: Diagnosis not present

## 2021-05-21 DIAGNOSIS — H903 Sensorineural hearing loss, bilateral: Secondary | ICD-10-CM | POA: Diagnosis not present

## 2021-05-31 DIAGNOSIS — E039 Hypothyroidism, unspecified: Secondary | ICD-10-CM | POA: Diagnosis not present

## 2021-05-31 DIAGNOSIS — N951 Menopausal and female climacteric states: Secondary | ICD-10-CM | POA: Diagnosis not present

## 2021-05-31 DIAGNOSIS — R519 Headache, unspecified: Secondary | ICD-10-CM | POA: Diagnosis not present

## 2021-06-02 DIAGNOSIS — R232 Flushing: Secondary | ICD-10-CM | POA: Diagnosis not present

## 2021-06-02 DIAGNOSIS — R5383 Other fatigue: Secondary | ICD-10-CM | POA: Diagnosis not present

## 2021-06-02 DIAGNOSIS — N951 Menopausal and female climacteric states: Secondary | ICD-10-CM | POA: Diagnosis not present

## 2021-06-02 DIAGNOSIS — E039 Hypothyroidism, unspecified: Secondary | ICD-10-CM | POA: Diagnosis not present

## 2021-06-02 DIAGNOSIS — Z6822 Body mass index (BMI) 22.0-22.9, adult: Secondary | ICD-10-CM | POA: Diagnosis not present

## 2021-06-02 DIAGNOSIS — M2550 Pain in unspecified joint: Secondary | ICD-10-CM | POA: Diagnosis not present

## 2021-06-07 ENCOUNTER — Ambulatory Visit: Payer: Medicare HMO

## 2021-06-07 DIAGNOSIS — Z1211 Encounter for screening for malignant neoplasm of colon: Secondary | ICD-10-CM | POA: Diagnosis not present

## 2021-06-07 DIAGNOSIS — Z1212 Encounter for screening for malignant neoplasm of rectum: Secondary | ICD-10-CM | POA: Diagnosis not present

## 2021-06-14 ENCOUNTER — Ambulatory Visit
Admission: RE | Admit: 2021-06-14 | Discharge: 2021-06-14 | Disposition: A | Discharge status: Home / Self Care | Payer: Medicare HMO | Source: Ambulatory Visit | Attending: Family | Admitting: Family

## 2021-06-14 DIAGNOSIS — Z1231 Encounter for screening mammogram for malignant neoplasm of breast: Secondary | ICD-10-CM

## 2021-06-14 DIAGNOSIS — R799 Abnormal finding of blood chemistry, unspecified: Secondary | ICD-10-CM | POA: Diagnosis not present

## 2021-06-17 LAB — COLOGUARD: COLOGUARD: NEGATIVE

## 2021-08-30 DIAGNOSIS — E039 Hypothyroidism, unspecified: Secondary | ICD-10-CM | POA: Diagnosis not present

## 2021-08-30 DIAGNOSIS — N951 Menopausal and female climacteric states: Secondary | ICD-10-CM | POA: Diagnosis not present

## 2021-09-01 DIAGNOSIS — R232 Flushing: Secondary | ICD-10-CM | POA: Diagnosis not present

## 2021-09-01 DIAGNOSIS — N898 Other specified noninflammatory disorders of vagina: Secondary | ICD-10-CM | POA: Diagnosis not present

## 2021-09-01 DIAGNOSIS — N951 Menopausal and female climacteric states: Secondary | ICD-10-CM | POA: Diagnosis not present

## 2021-09-01 DIAGNOSIS — Z6822 Body mass index (BMI) 22.0-22.9, adult: Secondary | ICD-10-CM | POA: Diagnosis not present

## 2021-11-29 DIAGNOSIS — E039 Hypothyroidism, unspecified: Secondary | ICD-10-CM | POA: Diagnosis not present

## 2021-11-29 DIAGNOSIS — N951 Menopausal and female climacteric states: Secondary | ICD-10-CM | POA: Diagnosis not present

## 2021-12-01 DIAGNOSIS — N951 Menopausal and female climacteric states: Secondary | ICD-10-CM | POA: Diagnosis not present

## 2021-12-01 DIAGNOSIS — R232 Flushing: Secondary | ICD-10-CM | POA: Diagnosis not present

## 2021-12-01 DIAGNOSIS — R69 Illness, unspecified: Secondary | ICD-10-CM | POA: Diagnosis not present

## 2021-12-01 DIAGNOSIS — N898 Other specified noninflammatory disorders of vagina: Secondary | ICD-10-CM | POA: Diagnosis not present

## 2022-02-04 DIAGNOSIS — R197 Diarrhea, unspecified: Secondary | ICD-10-CM | POA: Diagnosis not present

## 2022-02-04 DIAGNOSIS — R102 Pelvic and perineal pain: Secondary | ICD-10-CM | POA: Diagnosis not present

## 2022-02-04 DIAGNOSIS — R1030 Lower abdominal pain, unspecified: Secondary | ICD-10-CM | POA: Diagnosis not present

## 2022-02-04 DIAGNOSIS — R1031 Right lower quadrant pain: Secondary | ICD-10-CM | POA: Diagnosis not present

## 2022-02-08 DIAGNOSIS — L219 Seborrheic dermatitis, unspecified: Secondary | ICD-10-CM | POA: Diagnosis not present

## 2022-02-08 DIAGNOSIS — L710 Perioral dermatitis: Secondary | ICD-10-CM | POA: Diagnosis not present

## 2022-02-16 DIAGNOSIS — D1803 Hemangioma of intra-abdominal structures: Secondary | ICD-10-CM | POA: Diagnosis not present

## 2022-02-16 DIAGNOSIS — N200 Calculus of kidney: Secondary | ICD-10-CM | POA: Diagnosis not present

## 2022-02-21 DIAGNOSIS — E039 Hypothyroidism, unspecified: Secondary | ICD-10-CM | POA: Diagnosis not present

## 2022-02-21 DIAGNOSIS — N951 Menopausal and female climacteric states: Secondary | ICD-10-CM | POA: Diagnosis not present

## 2022-02-23 DIAGNOSIS — E039 Hypothyroidism, unspecified: Secondary | ICD-10-CM | POA: Diagnosis not present

## 2022-02-23 DIAGNOSIS — R232 Flushing: Secondary | ICD-10-CM | POA: Diagnosis not present

## 2022-02-23 DIAGNOSIS — Z79890 Hormone replacement therapy (postmenopausal): Secondary | ICD-10-CM | POA: Diagnosis not present

## 2022-02-23 DIAGNOSIS — N898 Other specified noninflammatory disorders of vagina: Secondary | ICD-10-CM | POA: Diagnosis not present

## 2022-02-23 DIAGNOSIS — N951 Menopausal and female climacteric states: Secondary | ICD-10-CM | POA: Diagnosis not present

## 2022-02-23 DIAGNOSIS — Z6822 Body mass index (BMI) 22.0-22.9, adult: Secondary | ICD-10-CM | POA: Diagnosis not present

## 2022-03-25 DIAGNOSIS — G5761 Lesion of plantar nerve, right lower limb: Secondary | ICD-10-CM | POA: Diagnosis not present

## 2022-03-25 DIAGNOSIS — M79672 Pain in left foot: Secondary | ICD-10-CM | POA: Diagnosis not present

## 2022-03-25 DIAGNOSIS — M7741 Metatarsalgia, right foot: Secondary | ICD-10-CM | POA: Diagnosis not present

## 2022-04-07 DIAGNOSIS — D3132 Benign neoplasm of left choroid: Secondary | ICD-10-CM | POA: Diagnosis not present

## 2022-04-07 DIAGNOSIS — H52223 Regular astigmatism, bilateral: Secondary | ICD-10-CM | POA: Diagnosis not present

## 2022-04-07 DIAGNOSIS — H43812 Vitreous degeneration, left eye: Secondary | ICD-10-CM | POA: Diagnosis not present

## 2022-04-07 DIAGNOSIS — H524 Presbyopia: Secondary | ICD-10-CM | POA: Diagnosis not present

## 2022-04-07 DIAGNOSIS — H25813 Combined forms of age-related cataract, bilateral: Secondary | ICD-10-CM | POA: Diagnosis not present

## 2022-04-07 DIAGNOSIS — H353131 Nonexudative age-related macular degeneration, bilateral, early dry stage: Secondary | ICD-10-CM | POA: Diagnosis not present

## 2022-04-07 DIAGNOSIS — H5203 Hypermetropia, bilateral: Secondary | ICD-10-CM | POA: Diagnosis not present

## 2022-04-12 DIAGNOSIS — G5761 Lesion of plantar nerve, right lower limb: Secondary | ICD-10-CM | POA: Diagnosis not present

## 2022-04-22 IMAGING — MG MM DIGITAL SCREENING BILAT W/ TOMO AND CAD
8 series · 9 of 24 positions shown · non-contrast
Comparison: Previous exam(s).

CLINICAL DATA: Screening.

EXAM:
DIGITAL SCREENING BILATERAL MAMMOGRAM WITH TOMOSYNTHESIS AND CAD
TECHNIQUE: Bilateral screening digital craniocaudal and mediolateral oblique
mammograms were obtained. Bilateral screening digital breast
tomosynthesis was performed. The images were evaluated with
computer-aided detection.

[R MLO synth-2D]
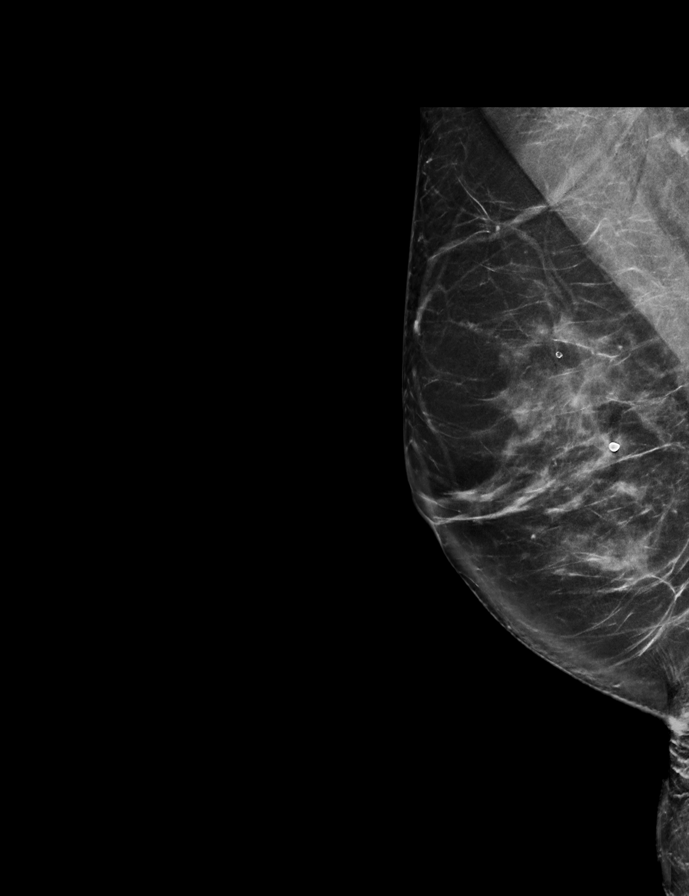

[L CC synth-2D]
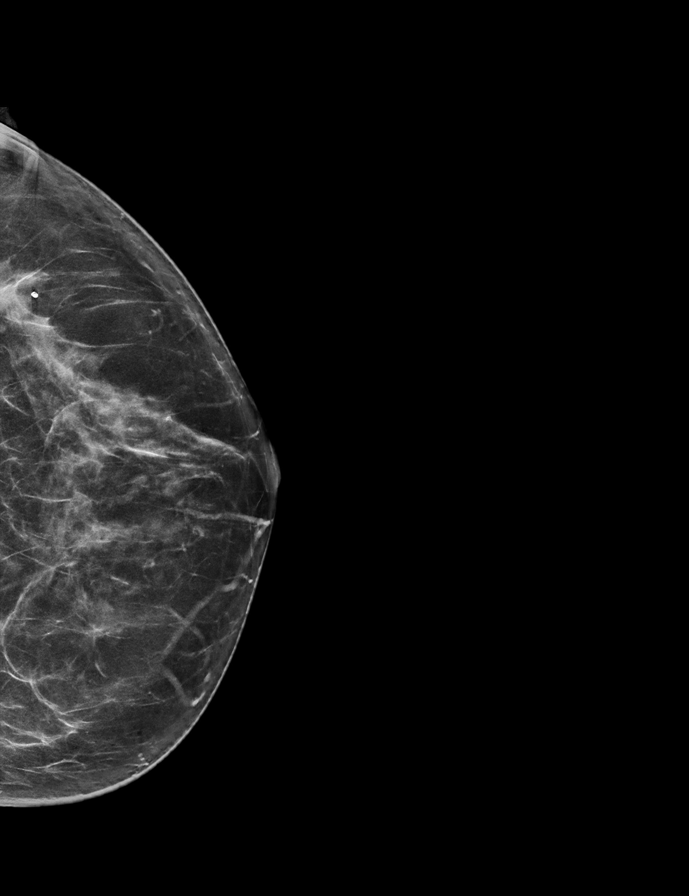

[R CC synth-2D]
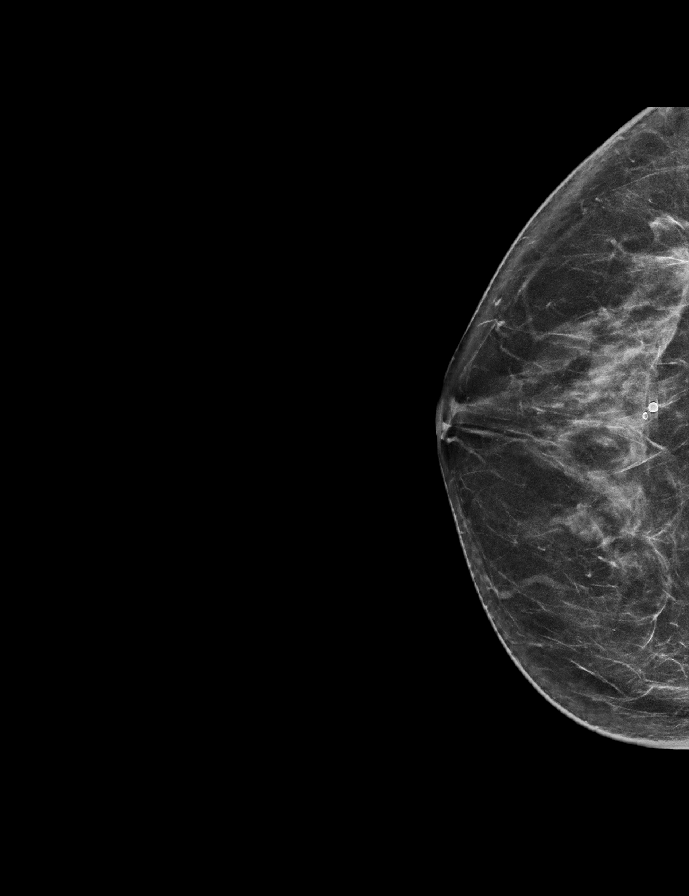

[L MLO synth-2D]
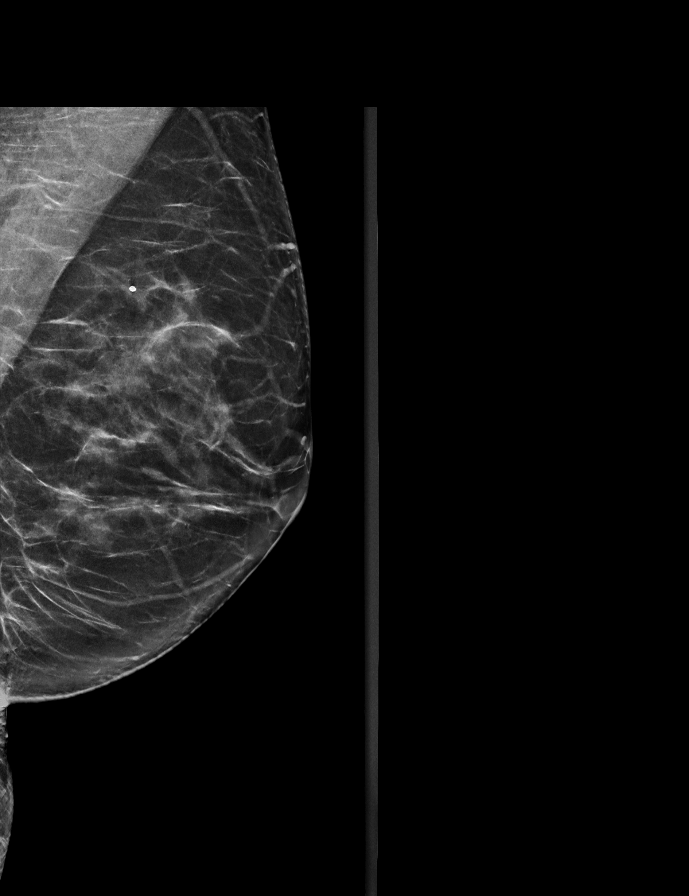

[R MLO tomo · 2 of 72 frames shown]
[frame 24/72]
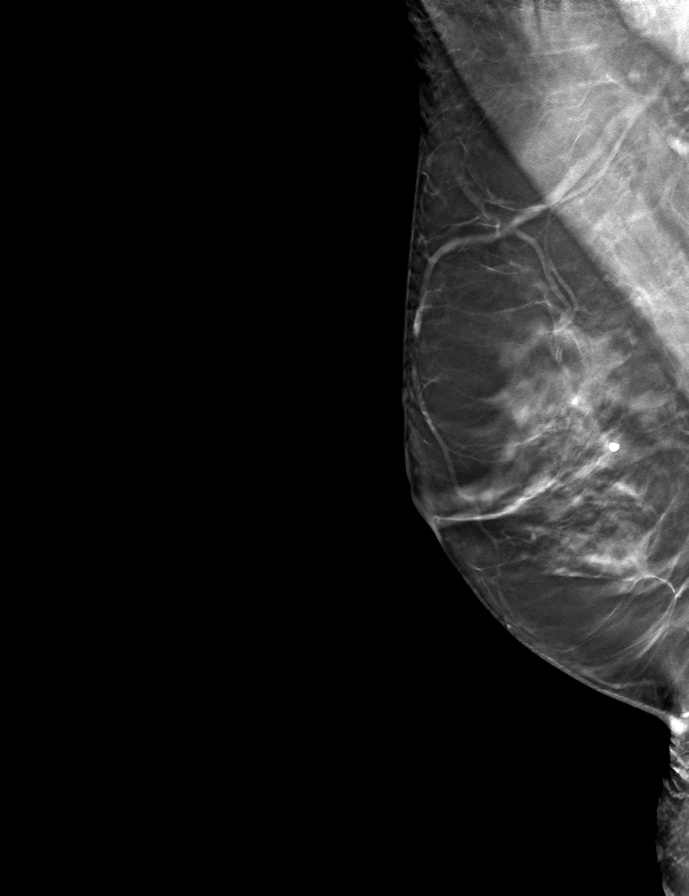
[frame 37/72]
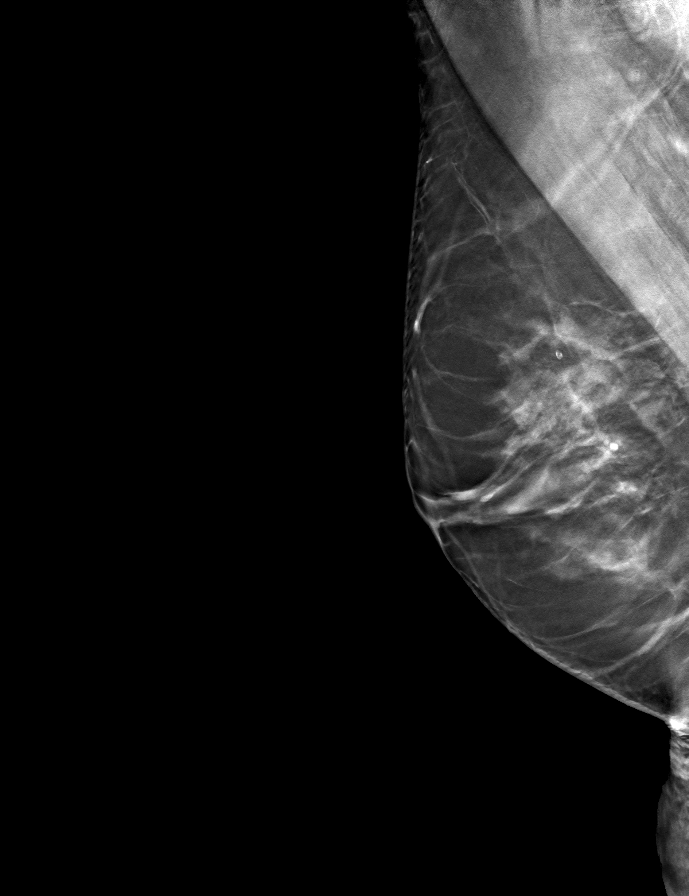

[R CC tomo · tomo slice 33/65.0]
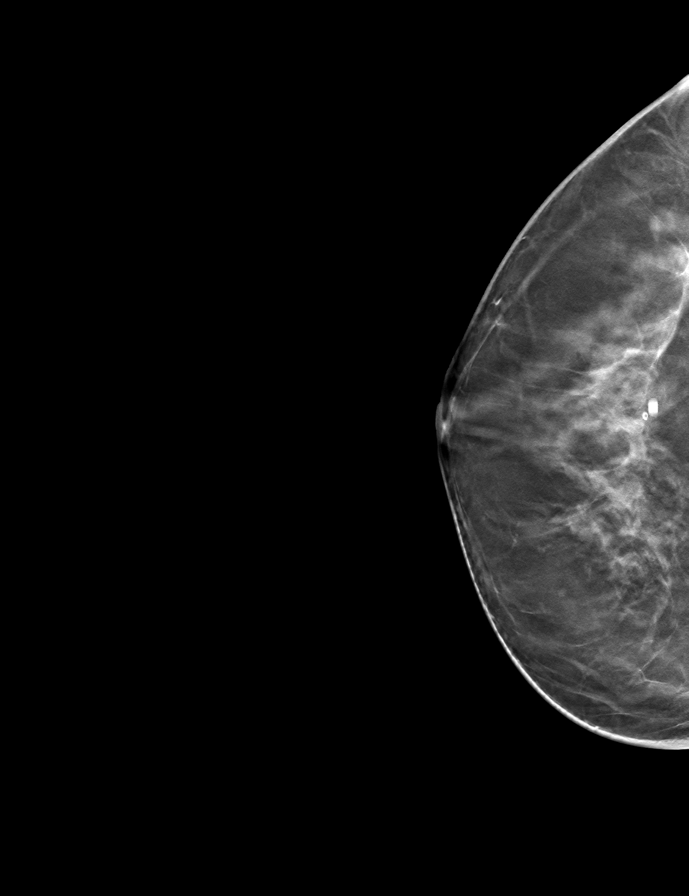

[L MLO tomo · tomo slice 33/66.0]
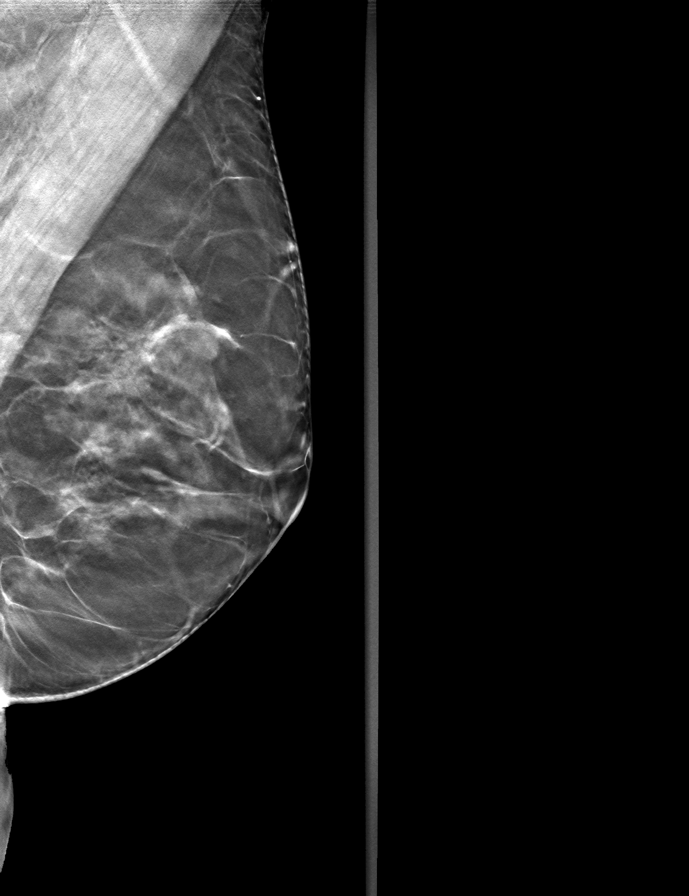

[L CC tomo · tomo slice 33/66.0]
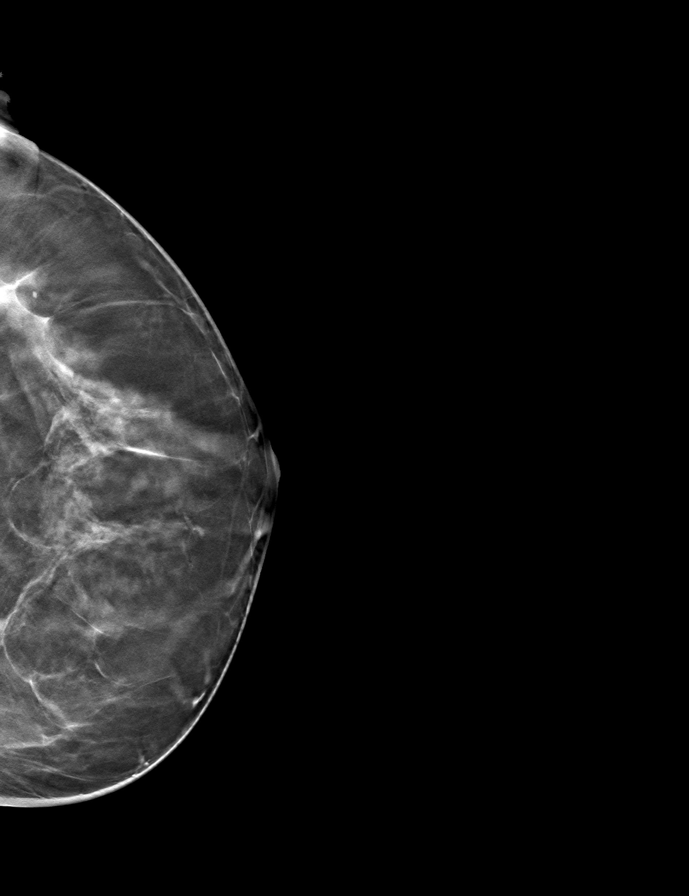

[9 of 24 positions shown; findings below may reference images not displayed]

ACR Breast Density Category c: The breast tissue is heterogeneously
dense, which may obscure small masses.
FINDINGS: There are no findings suspicious for malignancy.
IMPRESSION: No mammographic evidence of malignancy. A result letter of this
screening mammogram will be mailed directly to the patient.

RECOMMENDATION:
Screening mammogram in one year. (Code:[id number]-BC3)

BI-RADS CATEGORY  1: Negative.

## 2022-05-16 ENCOUNTER — Other Ambulatory Visit: Payer: Self-pay | Admitting: Physician Assistant

## 2022-05-16 DIAGNOSIS — Z1231 Encounter for screening mammogram for malignant neoplasm of breast: Secondary | ICD-10-CM

## 2022-05-23 DIAGNOSIS — N951 Menopausal and female climacteric states: Secondary | ICD-10-CM | POA: Diagnosis not present

## 2022-05-23 DIAGNOSIS — E039 Hypothyroidism, unspecified: Secondary | ICD-10-CM | POA: Diagnosis not present

## 2022-05-25 DIAGNOSIS — R5383 Other fatigue: Secondary | ICD-10-CM | POA: Diagnosis not present

## 2022-05-25 DIAGNOSIS — Z6822 Body mass index (BMI) 22.0-22.9, adult: Secondary | ICD-10-CM | POA: Diagnosis not present

## 2022-05-25 DIAGNOSIS — N898 Other specified noninflammatory disorders of vagina: Secondary | ICD-10-CM | POA: Diagnosis not present

## 2022-05-25 DIAGNOSIS — M2550 Pain in unspecified joint: Secondary | ICD-10-CM | POA: Diagnosis not present

## 2022-05-25 DIAGNOSIS — Z79890 Hormone replacement therapy (postmenopausal): Secondary | ICD-10-CM | POA: Diagnosis not present

## 2022-05-25 DIAGNOSIS — N951 Menopausal and female climacteric states: Secondary | ICD-10-CM | POA: Diagnosis not present

## 2022-05-25 DIAGNOSIS — R232 Flushing: Secondary | ICD-10-CM | POA: Diagnosis not present

## 2022-06-10 DIAGNOSIS — M25531 Pain in right wrist: Secondary | ICD-10-CM | POA: Diagnosis not present

## 2022-06-13 DIAGNOSIS — S52501A Unspecified fracture of the lower end of right radius, initial encounter for closed fracture: Secondary | ICD-10-CM | POA: Diagnosis not present

## 2022-06-24 ENCOUNTER — Ambulatory Visit: Payer: Medicare HMO

## 2022-06-30 ENCOUNTER — Ambulatory Visit
Admission: RE | Admit: 2022-06-30 | Discharge: 2022-06-30 | Disposition: A | Discharge status: Home / Self Care | Payer: Medicare HMO | Source: Ambulatory Visit | Attending: Physician Assistant | Admitting: Physician Assistant

## 2022-06-30 DIAGNOSIS — Z1231 Encounter for screening mammogram for malignant neoplasm of breast: Secondary | ICD-10-CM | POA: Diagnosis not present

## 2022-07-04 DIAGNOSIS — M1812 Unilateral primary osteoarthritis of first carpometacarpal joint, left hand: Secondary | ICD-10-CM | POA: Diagnosis not present

## 2022-07-04 DIAGNOSIS — M25531 Pain in right wrist: Secondary | ICD-10-CM | POA: Diagnosis not present

## 2022-07-04 DIAGNOSIS — S52501D Unspecified fracture of the lower end of right radius, subsequent encounter for closed fracture with routine healing: Secondary | ICD-10-CM | POA: Diagnosis not present

## 2022-07-14 DIAGNOSIS — M25561 Pain in right knee: Secondary | ICD-10-CM | POA: Diagnosis not present

## 2022-07-26 DIAGNOSIS — D485 Neoplasm of uncertain behavior of skin: Secondary | ICD-10-CM | POA: Diagnosis not present

## 2022-07-26 DIAGNOSIS — L821 Other seborrheic keratosis: Secondary | ICD-10-CM | POA: Diagnosis not present

## 2022-07-26 DIAGNOSIS — L859 Epidermal thickening, unspecified: Secondary | ICD-10-CM | POA: Diagnosis not present

## 2022-07-26 DIAGNOSIS — D1801 Hemangioma of skin and subcutaneous tissue: Secondary | ICD-10-CM | POA: Diagnosis not present

## 2022-07-26 DIAGNOSIS — L578 Other skin changes due to chronic exposure to nonionizing radiation: Secondary | ICD-10-CM | POA: Diagnosis not present

## 2022-07-26 DIAGNOSIS — L820 Inflamed seborrheic keratosis: Secondary | ICD-10-CM | POA: Diagnosis not present

## 2022-07-26 DIAGNOSIS — R202 Paresthesia of skin: Secondary | ICD-10-CM | POA: Diagnosis not present

## 2022-07-28 ENCOUNTER — Ambulatory Visit: Payer: Medicare HMO | Attending: Student | Admitting: Physical Therapy

## 2022-07-28 ENCOUNTER — Encounter: Payer: Self-pay | Admitting: Physical Therapy

## 2022-07-28 ENCOUNTER — Other Ambulatory Visit: Payer: Self-pay

## 2022-07-28 DIAGNOSIS — M6281 Muscle weakness (generalized): Secondary | ICD-10-CM

## 2022-07-28 DIAGNOSIS — R29898 Other symptoms and signs involving the musculoskeletal system: Secondary | ICD-10-CM | POA: Diagnosis not present

## 2022-07-28 DIAGNOSIS — M25552 Pain in left hip: Secondary | ICD-10-CM

## 2022-07-28 NOTE — Therapy (Signed)
OUTPATIENT PHYSICAL THERAPY LOWER EXTREMITY EVALUATION   Patient Name: Carol Hoover MRN: [id number] DOB:07-Jul-1950, 72 y.o., female Today's Date: 07/28/2022  END OF SESSION:  PT End of Session - 07/28/22 1522     Visit Number 1    Number of Visits 13    Date for PT Re-Evaluation 09/08/22    Authorization Type [employer]    Authorization Time Period 07/28/22 to 09/08/22    Progress Note Due on Visit 10    PT Start Time 1433    PT Stop Time 1511    PT Time Calculation (min) 38 min    Activity Tolerance Patient tolerated treatment well    Behavior During Therapy [facility] for tasks assessed/performed             Past Medical History:  Diagnosis Date   Arthritis    Trigger finger    Past Surgical History:  Procedure Laterality Date   TUBAL LIGATION     Patient Active Problem List   Diagnosis Date Noted   Osteoarthritis of knee 12/19/2018   History of deep vein thrombosis 12/19/2018   Acute deep vein thrombosis (DVT) of popliteal vein of left lower extremity (HCC) 04/25/2018   Acute deep vein thrombosis (DVT) of left peroneal vein (HCC) 04/25/2018   Well female exam with routine gynecological exam 03/22/2016    PCP: Betsey Holiday PA-C   REFERRING PROVIDER: Derenda Fennel, PA  REFERRING DIAG: [phone] (ICD-10-CM) - Pain in right knee  THERAPY DIAG:  Pain in left hip  Muscle weakness (generalized)  Other symptoms and signs involving the musculoskeletal system  Rationale for Evaluation and Treatment: Rehabilitation  ONSET DATE:  6 weeks ago   SUBJECTIVE:   SUBJECTIVE STATEMENT:  I fell six weeks ago and broke my hand while hiking, now the left hip is also bothering me now after the fall. My chiropractor told me that I have a tilted pelvis, but that was 2 years ago. Hip bothers me when I walk longer distances, tends to get numb also the way to down my knee sometimes and gets better within 3 days. Having some swelling in the hip and thigh too, but this depends  on activity.   PERTINENT HISTORY: OA, hx DVT, hx TKR  PAIN:  Are you having pain? No 0/10 at rest, at worst 4-5/10  PRECAUTIONS: None  WEIGHT BEARING RESTRICTIONS: No  FALLS:  Has patient fallen in last 6 months? Yes. Number of falls 1- tripped on root when I fell   LIVING ENVIRONMENT: Lives with: lives with their partner Lives in: House/apartment Stairs: 1 STE,no steps inside  Has following equipment at home: None  OCCUPATION: dog groomer  PLOF: Independent, Independent with basic ADLs, Independent with gait, and Independent with transfers  PATIENT GOALS: get back to long distance hiking   NEXT MD VISIT: Referring in a couple weeks   OBJECTIVE:   DIAGNOSTIC FINDINGS:   PATIENT SURVEYS:  FOTO 58, predicted 76 in 11 visits   COGNITION: Overall cognitive status: Within functional limits for tasks assessed     SENSATION: On and off numbness starting proximal thigh and going down to knee   EDEMA:   Reports on and off edema in thigh  MUSCLE LENGTH:  Hip flexors mod limitation R quad mild limitation, L quad moderate limitation   POSTURE: R LE about 1/4 inch longer than L LE   PALPATION:  Lumbar area tight but not tender, L glute tight but not tender, TFL not tender, lateral thigh and  proximal quad very tight and tender   LOWER EXTREMITY ROM:  Passive ROM Right eval Left eval  Hip flexion WNL  WNL   Hip extension  Moderate limitation   Hip abduction  WNL   Hip adduction    Hip internal rotation  WNL   Hip external rotation  WNL   Knee flexion    Knee extension    Ankle dorsiflexion    Ankle plantarflexion    Ankle inversion    Ankle eversion     (Blank rows = not tested)  LOWER EXTREMITY MMT:  MMT Right eval Left eval  Hip flexion 3 3  Hip extension 3+ 3+  Hip abduction 4+ 4+  Hip adduction    Hip internal rotation    Hip external rotation    Knee flexion 4+ 4+  Knee extension 4+ 4+  Ankle dorsiflexion 5 5  Ankle plantarflexion     Ankle inversion    Ankle eversion     (Blank rows = not tested)   HIP SPECIAL TESTS  Scour test negative FABER test negative    TODAY'S TREATMENT:                                                                                                                              DATE:   Eval  Objective measures, appropriate education, care planning    TherEx  Single leg bridges x10 B Quad and hip flexor stretch 3x30 seconds L LE Seated marches into red TB x10 B eccentric lower Quadruped hip ABD x10 B red TB   PATIENT EDUCATION:  Education details: exam findings, POC, HEP  Person educated: Patient Education method: Programmer, multimedia, Demonstration, and Handouts Education comprehension: verbalized understanding, returned demonstration, and needs further education  HOME EXERCISE PROGRAM: Access Code: [facility] URL: [email or web address] Date: 07/28/2022 Prepared by: Nedra Hai  Exercises - Single Leg Bridge  - 2 x daily - 7 x weekly - 1 sets - 10 reps - 1 hold - Supine Quadriceps Stretch with Strap on Table  - 2 x daily - 7 x weekly - 1 sets - 3 reps - 30 hold - Seated March with Resistance  - 2 x daily - 7 x weekly - 1 sets - 10 reps - 1 hold - Quadruped Hip Abduction with Resistance Loop  - 2 x daily - 7 x weekly - 1 sets - 10 reps - 1 hold  ASSESSMENT:  CLINICAL IMPRESSION: Patient is a 72 y.o. F who was seen today for physical therapy evaluation and treatment for  tilted pelvis/hip pain. Exam reveals some functional muscle weakness, limited muscle flexibility in proximal quad as well as hip flexor L LE, leg length difference, and muscle spasms in L hip and thigh musculature. Will benefit from skilled PT services to address pain and functional limitations as well as to assist her in return to pain free long distance hiking.    OBJECTIVE IMPAIRMENTS: Abnormal gait, difficulty walking, decreased ROM,  decreased strength, increased edema, increased muscle spasms,  impaired flexibility, and pain.   ACTIVITY LIMITATIONS: squatting and locomotion level  PARTICIPATION LIMITATIONS: community activity  PERSONAL FACTORS: Fitness, Past/current experiences, and Time since onset of injury/illness/exacerbation are also affecting patient's functional outcome.   REHAB POTENTIAL: Excellent  CLINICAL DECISION MAKING: Stable/uncomplicated  EVALUATION COMPLEXITY: Low   GOALS: Goals reviewed with patient? Yes  SHORT TERM GOALS: Target date: 08/18/2022   Will be compliant with appropriate progressive HEP  Baseline: Goal status: INITIAL  2.  Frequency of numbness and edema L hip/thigh to have improved by 50%  Baseline:  Goal status: INITIAL  3.  Pain in L hip to be no more than 1/10 at worst with challenging activities  Baseline:  Goal status: INITIAL  4.  Quad and hip flexor flexibility limitations to have improved by 50%  Baseline:  Goal status: INITIAL   LONG TERM GOALS: Target date: 09/08/2022    MMT to have improved by 1 grade in all weak groups  Baseline:  Goal status: INITIAL  2.  Will be able to hike at least 10 miles without increase in pain L hip  Baseline:  Goal status: INITIAL  3.  Numbness and edema L hip and thigh to have completely resolved  Baseline:  Goal status: INITIAL  4.  FOTO score to have improved to goal level by time of DC to show subjective functional improvement  Baseline:  Goal status: INITIAL     PLAN:  PT FREQUENCY: 2x/week  PT DURATION: 6 weeks  PLANNED INTERVENTIONS: Therapeutic exercises, Therapeutic activity, Gait training, Patient/Family education, Self Care, Joint mobilization, Aquatic Therapy, Dry Needling, Cryotherapy, Moist heat, Taping, Ionotophoresis 4mg /ml Dexamethasone, Manual therapy, and Re-evaluation  PLAN FOR NEXT SESSION: functional strengthening for weak groups, flexibility work for muscles in spasm- consider DN to proximal quad/hip flexor? Trial MET for leg length difference vs  placing trial heel lift   Nedra Hai PT DPT PN2

## 2022-08-02 ENCOUNTER — Encounter: Payer: Medicare HMO | Admitting: Physical Therapy

## 2022-08-03 ENCOUNTER — Ambulatory Visit: Payer: Medicare HMO | Admitting: Physical Therapy

## 2022-08-03 ENCOUNTER — Encounter: Payer: Self-pay | Admitting: Physical Therapy

## 2022-08-03 DIAGNOSIS — M25552 Pain in left hip: Secondary | ICD-10-CM

## 2022-08-03 DIAGNOSIS — M6281 Muscle weakness (generalized): Secondary | ICD-10-CM

## 2022-08-03 DIAGNOSIS — R29898 Other symptoms and signs involving the musculoskeletal system: Secondary | ICD-10-CM | POA: Diagnosis not present

## 2022-08-03 NOTE — Therapy (Signed)
OUTPATIENT PHYSICAL THERAPY LOWER EXTREMITY TREATMENT   Patient Name: Carol Hoover MRN: [id number] DOB:October 19, 1950, 72 y.o., female Today's Date: 08/03/2022  END OF SESSION:  PT End of Session - 08/03/22 1351     Visit Number 2    Number of Visits 13    Date for PT Re-Evaluation 09/08/22    Authorization Type [employer]    Authorization Time Period 07/28/22 to 09/08/22    Progress Note Due on Visit 10    PT Start Time 1348    PT Stop Time 1428    PT Time Calculation (min) 40 min    Activity Tolerance Patient tolerated treatment well    Behavior During Therapy [facility] for tasks assessed/performed            Past Medical History:  Diagnosis Date   Arthritis    Trigger finger    Past Surgical History:  Procedure Laterality Date   TUBAL LIGATION     Patient Active Problem List   Diagnosis Date Noted   Osteoarthritis of knee 12/19/2018   History of deep vein thrombosis 12/19/2018   Acute deep vein thrombosis (DVT) of popliteal vein of left lower extremity (HCC) 04/25/2018   Acute deep vein thrombosis (DVT) of left peroneal vein (HCC) 04/25/2018   Well female exam with routine gynecological exam 03/22/2016   PCP: Betsey Holiday PA-C   REFERRING PROVIDER: Derenda Fennel, PA  REFERRING DIAG: [phone] (ICD-10-CM) - Pain in right knee  THERAPY DIAG:  Pain in left hip  Muscle weakness (generalized)  Other symptoms and signs involving the musculoskeletal system  Rationale for Evaluation and Treatment: Rehabilitation  ONSET DATE:  6 weeks ago   SUBJECTIVE:   SUBJECTIVE STATEMENT: Reports LLE swelling. No hiking prior to PT today.  PERTINENT HISTORY: OA, hx DVT, hx TKR   PAIN:  Are you having pain? No   PRECAUTIONS: None  WEIGHT BEARING RESTRICTIONS: No  FALLS:  Has patient fallen in last 6 months? Yes. Number of falls 1- tripped on root when I fell   PATIENT GOALS: get back to long distance hiking   NEXT MD VISIT: Referring in a couple weeks    OBJECTIVE:   DIAGNOSTIC FINDINGS:   PATIENT SURVEYS:  FOTO 66, predicted 76 in 11 visits   COGNITION: Overall cognitive status: Within functional limits for tasks assessed     SENSATION: On and off numbness starting proximal thigh and going down to knee   EDEMA: Reports on and off edema in thigh  MUSCLE LENGTH: Hip flexors mod limitation R quad mild limitation, L quad moderate limitation  POSTURE: RLE about 1/4 inch longer than L LE   PALPATION: Lumbar area tight but not tender, L glute tight but not tender, TFL not tender, lateral thigh and proximal quad very tight and tender   LOWER EXTREMITY ROM:  Passive ROM Right eval Left eval  Hip flexion WNL  WNL   Hip extension  Moderate limitation   Hip abduction  WNL   Hip adduction    Hip internal rotation  WNL   Hip external rotation  WNL   Knee flexion    Knee extension    Ankle dorsiflexion    Ankle plantarflexion    Ankle inversion    Ankle eversion     (Blank rows = not tested)  LOWER EXTREMITY MMT:  MMT Right eval Left eval  Hip flexion 3 3  Hip extension 3+ 3+  Hip abduction 4+ 4+  Hip adduction    Hip  internal rotation    Hip external rotation    Knee flexion 4+ 4+  Knee extension 4+ 4+  Ankle dorsiflexion 5 5  Ankle plantarflexion    Ankle inversion    Ankle eversion     (Blank rows = not tested)  HIP SPECIAL TESTS Scour test negative FABER test negative   TODAY'S TREATMENT:                                                                                                                              DATE:    08/03/22 EXERCISE LOG  Exercise Repetitions and Resistance Comments  Bike L1 x10 min   L SKTC 2x60 sec holds   L figure 4 with ovepressure 2x60 sec holds   Bridges with clam X15 reps   Ball squeeze X15 reps 3 sec holds   SL clam with band Green theraband 2x10 reps   Prone hip extension Green theraband x10 reps    Blank cell = exercise not performed today   PATIENT EDUCATION:   Education details: exam findings, POC, HEP  Person educated: Patient Education method: Explanation, Demonstration, and Handouts Education comprehension: verbalized understanding, returned demonstration, and needs further education  HOME EXERCISE PROGRAM: Access Code: [facility] URL: [email or web address] Date: 07/28/2022 Prepared by: Nedra Hai  Exercises - Single Leg Bridge  - 2 x daily - 7 x weekly - 1 sets - 10 reps - 1 hold - Supine Quadriceps Stretch with Strap on Table  - 2 x daily - 7 x weekly - 1 sets - 3 reps - 30 hold - Seated March with Resistance  - 2 x daily - 7 x weekly - 1 sets - 10 reps - 1 hold - Quadruped Hip Abduction with Resistance Loop  - 2 x daily - 7 x weekly - 1 sets - 10 reps - 1 hold  ASSESSMENT:  CLINICAL IMPRESSION: Patient presented in clinic with no current hip pain. Patient has not completed HEP from evaluation but was highly encouraged to start along with addition of green theraband to progress strengthening. LLD was assessed by [place] Applegate, MPT which he found her to have equal leg lengths today. More tender of the L superior glute and has noticed some LLE swelling she reported. Patient progressed to more glute strengthening and stretching with no complaints of any increased pain. Patient was provided verbal education on DN as per POC and consent form.  OBJECTIVE IMPAIRMENTS: Abnormal gait, difficulty walking, decreased ROM, decreased strength, increased edema, increased muscle spasms, impaired flexibility, and pain.   ACTIVITY LIMITATIONS: squatting and locomotion level  PARTICIPATION LIMITATIONS: community activity  PERSONAL FACTORS: Fitness, Past/current experiences, and Time since onset of injury/illness/exacerbation are also affecting patient's functional outcome.   REHAB POTENTIAL: Excellent  CLINICAL DECISION MAKING: Stable/uncomplicated  EVALUATION COMPLEXITY: Low  GOALS: Goals reviewed with patient? Yes  SHORT TERM  GOALS: Target date: 08/18/2022   Will be compliant with appropriate progressive HEP  Baseline: Goal status:  INITIAL  2.  Frequency of numbness and edema L hip/thigh to have improved by 50%  Baseline:  Goal status: INITIAL  3.  Pain in L hip to be no more than 1/10 at worst with challenging activities  Baseline:  Goal status: INITIAL  4.  Quad and hip flexor flexibility limitations to have improved by 50%  Baseline:  Goal status: INITIAL   LONG TERM GOALS: Target date: 09/08/2022  MMT to have improved by 1 grade in all weak groups  Baseline:  Goal status: INITIAL  2.  Will be able to hike at least 10 miles without increase in pain L hip  Baseline:  Goal status: INITIAL  3.  Numbness and edema L hip and thigh to have completely resolved  Baseline:  Goal status: INITIAL  4.  FOTO score to have improved to goal level by time of DC to show subjective functional improvement  Baseline:  Goal status: INITIAL  PLAN:  PT FREQUENCY: 2x/week  PT DURATION: 6 weeks  PLANNED INTERVENTIONS: Therapeutic exercises, Therapeutic activity, Gait training, Patient/Family education, Self Care, Joint mobilization, Aquatic Therapy, Dry Needling, Cryotherapy, Moist heat, Taping, Ionotophoresis 4mg /ml Dexamethasone, Manual therapy, and Re-evaluation  PLAN FOR NEXT SESSION: functional strengthening for weak groups, flexibility work for muscles in spasm- consider DN to proximal quad/hip flexor? Trial MET for leg length difference vs placing trial heel lift   Marvell Fuller, PTA 08/03/22 2:41 PM

## 2022-08-03 NOTE — Patient Instructions (Signed)
[facility]  DRY NEEDLING CONSENT FORM   Trigger point dry needling is a physical therapy approach to treat Myofascial Pain and Dysfunction.  Dry Needling (DN) is a valuable and effective way to deactivate myofascial trigger points (muscle knots). It is skilled intervention that uses a thin filiform needle to penetrate the skin and stimulate underlying myofascial trigger points, muscular, and connective tissues for the management of neuromusculoskeletal pain and movement impairments.  A local twitch response (LTR) will be elicited.  This can sometimes feel like a deep ache in the muscle during the procedure. Multiple trigger points in multiple muscles can be treated during each treatment.  No medication of any kind is injected.   As with any medical treatment and procedure, there are possible adverse events.  While significant adverse events are uncommon, they do sometimes occur and must be considered prior to giving consent.  1. Dry needling often causes a "post needling soreness".  There can be an increase in pain from a couple of hours to 2-3 days, followed by an improvement in the overall pain state. 2. Any time a needle is used there is a risk of infection.  However, we are using new, sterile, and disposable needles; infections are extremely rare. 3. There is a possibility that you may bleed or bruise.  You may feel tired and some nausea following treatment. 4. There is a rare possibility of a pneumothorax (air in the chest cavity). 5. Allergic reaction to nickel in the stainless steel needle. 6. If a nerve is touched, it may cause paresthesia (a prickling/shock sensation) which is usually brief, but may continue for a couple of days.  Following treatment stay hydrated.  Continue regular activities but not too vigorous initially after treatment for 24-48 hours. You may apply heat to sore muscles.  Dry Needling is best when combined with other physical therapy  interventions such as strengthening, stretching and other therapeutic modalities.     PLEASE ANSWER THE FOLLOWING QUESTIONS:  Do you have a lack of sensation?   Y/N  Do you have a phobia or fear of needles  Y/N  Are you pregnant?    Y/N If yes:  How many weeks? _____  Do you have any implanted devices?  Y/N If yes:  Pacemaker/Spinal Cord         Stimulator/Deep Brain         Stimulator/Insulin          Pump/Other: ____________ Do you have any implants?   Y/N If yes:      Do you take any blood thinners?   Y/N If yes: Coumadin          (Warfarin)/Other:  Do you have a bleeding disorder?   Y/N If yes: What kind:   Do you take any immunosuppressants?  Y/N If yes:   What kind:   Do you take anti-inflammatories?   Y/N If yes: What kind:  Have you ever been diagnosed with Scoliosis? Y/N  Have you had back surgery?    Y/N If yes:         Laminectomy/Fusion/Other:   I have read, or had read to me, the above.  I have had the opportunity to ask any questions.  All of my questions have been answered to my satisfaction and I understand the risks involved with dry needling.  I consent to examination and treatment at [facility], including dry needling, of any and all of my involved and affected   muscles.    

## 2022-08-04 ENCOUNTER — Ambulatory Visit: Payer: Medicare HMO | Admitting: *Deleted

## 2022-08-04 DIAGNOSIS — M6281 Muscle weakness (generalized): Secondary | ICD-10-CM

## 2022-08-04 DIAGNOSIS — R29898 Other symptoms and signs involving the musculoskeletal system: Secondary | ICD-10-CM | POA: Diagnosis not present

## 2022-08-04 DIAGNOSIS — M25552 Pain in left hip: Secondary | ICD-10-CM | POA: Diagnosis not present

## 2022-08-04 NOTE — Therapy (Signed)
OUTPATIENT PHYSICAL THERAPY LOWER EXTREMITY TREATMENT   Patient Name: AUDRIELLA CREGAR MRN: [id number] DOB:February 12, 1951, 72 y.o., female Today's Date: 08/04/2022  END OF SESSION:  PT End of Session - 08/04/22 1519     Visit Number 3    Number of Visits 13    Date for PT Re-Evaluation 09/08/22    Authorization Type [employer]    Authorization Time Period 07/28/22 to 09/08/22    Progress Note Due on Visit 10    PT Start Time 1515    PT Stop Time 1603    PT Time Calculation (min) 48 min            Past Medical History:  Diagnosis Date   Arthritis    Trigger finger    Past Surgical History:  Procedure Laterality Date   TUBAL LIGATION     Patient Active Problem List   Diagnosis Date Noted   Osteoarthritis of knee 12/19/2018   History of deep vein thrombosis 12/19/2018   Acute deep vein thrombosis (DVT) of popliteal vein of left lower extremity (HCC) 04/25/2018   Acute deep vein thrombosis (DVT) of left peroneal vein (HCC) 04/25/2018   Well female exam with routine gynecological exam 03/22/2016   PCP: Betsey Holiday PA-C   REFERRING PROVIDER: Derenda Fennel, PA  REFERRING DIAG: [phone] (ICD-10-CM) - Pain in right knee  THERAPY DIAG:  Pain in left hip  Muscle weakness (generalized)  Other symptoms and signs involving the musculoskeletal system  Rationale for Evaluation and Treatment: Rehabilitation  ONSET DATE:  6 weeks ago   SUBJECTIVE:   SUBJECTIVE STATEMENT: Did good after last Rx and minimal pain today.  PERTINENT HISTORY: OA, hx DVT, hx TKR   PAIN:  Are you having pain? No   PRECAUTIONS: None  WEIGHT BEARING RESTRICTIONS: No  FALLS:  Has patient fallen in last 6 months? Yes. Number of falls 1- tripped on root when I fell   PATIENT GOALS: get back to long distance hiking   NEXT MD VISIT: Referring in a couple weeks   OBJECTIVE:   DIAGNOSTIC FINDINGS:   PATIENT SURVEYS:  FOTO 66, predicted 76 in 11 visits   COGNITION: Overall  cognitive status: Within functional limits for tasks assessed     SENSATION: On and off numbness starting proximal thigh and going down to knee   EDEMA: Reports on and off edema in thigh  MUSCLE LENGTH: Hip flexors mod limitation R quad mild limitation, L quad moderate limitation  POSTURE: RLE about 1/4 inch longer than L LE   PALPATION: Lumbar area tight but not tender, L glute tight but not tender, TFL not tender, lateral thigh and proximal quad very tight and tender   LOWER EXTREMITY ROM:  Passive ROM Right eval Left eval  Hip flexion WNL  WNL   Hip extension  Moderate limitation   Hip abduction  WNL   Hip adduction    Hip internal rotation  WNL   Hip external rotation  WNL   Knee flexion    Knee extension    Ankle dorsiflexion    Ankle plantarflexion    Ankle inversion    Ankle eversion     (Blank rows = not tested)  LOWER EXTREMITY MMT:  MMT Right eval Left eval  Hip flexion 3 3  Hip extension 3+ 3+  Hip abduction 4+ 4+  Hip adduction    Hip internal rotation    Hip external rotation    Knee flexion 4+ 4+  Knee extension 4+  4+  Ankle dorsiflexion 5 5  Ankle plantarflexion    Ankle inversion    Ankle eversion     (Blank rows = not tested)  HIP SPECIAL TESTS Scour test negative FABER test negative   TODAY'S TREATMENT:                                                                                                                              DATE:    08/04/22 EXERCISE LOG  Exercise Repetitions and Resistance Comments  Bike L4 x10 min   L SKTC    L figure 4 with ovepressure 3x60 sec holds   Bridges with clam X15 reps hold 5 secs   Ball squeeze X15 reps 5 sec holds   Bridge with ball squeeze    SL clam with band Green theraband 2x10 reps   Prone hip extension 2x10  bil    Blank cell = exercise not performed today   Discussed SIJ belt and Pt to order and bring to clinic next Rx  PATIENT EDUCATION:  Education details: exam findings, POC, HEP   Person educated: Patient Education method: Explanation, Demonstration, and Handouts Education comprehension: verbalized understanding, returned demonstration, and needs further education  HOME EXERCISE PROGRAM: Access Code: [facility] URL: [email or web address] Date: 07/28/2022 Prepared by: Nedra Hai  Exercises - Single Leg Bridge  - 2 x daily - 7 x weekly - 1 sets - 10 reps - 1 hold - Supine Quadriceps Stretch with Strap on Table  - 2 x daily - 7 x weekly - 1 sets - 3 reps - 30 hold - Seated March with Resistance  - 2 x daily - 7 x weekly - 1 sets - 10 reps - 1 hold - Quadruped Hip Abduction with Resistance Loop  - 2 x daily - 7 x weekly - 1 sets - 10 reps - 1 hold  ASSESSMENT:  CLINICAL IMPRESSION: Patient presented in clinic with no current hip pain. Patient was able to continue with LB/SIJ stabilization with glute strengthening  with no complaints of any increased pain. We discussed SIJ belt for stability while hiking and Pt plans to order before next visit.  OBJECTIVE IMPAIRMENTS: Abnormal gait, difficulty walking, decreased ROM, decreased strength, increased edema, increased muscle spasms, impaired flexibility, and pain.   ACTIVITY LIMITATIONS: squatting and locomotion level  PARTICIPATION LIMITATIONS: community activity  PERSONAL FACTORS: Fitness, Past/current experiences, and Time since onset of injury/illness/exacerbation are also affecting patient's functional outcome.   REHAB POTENTIAL: Excellent  CLINICAL DECISION MAKING: Stable/uncomplicated  EVALUATION COMPLEXITY: Low  GOALS: Goals reviewed with patient? Yes  SHORT TERM GOALS: Target date: 08/18/2022   Will be compliant with appropriate progressive HEP  Baseline: Goal status: INITIAL  2.  Frequency of numbness and edema L hip/thigh to have improved by 50%  Baseline:  Goal status: INITIAL  3.  Pain in L hip to be no more than 1/10 at worst with challenging activities  Baseline:  Goal  status:  INITIAL  4.  Quad and hip flexor flexibility limitations to have improved by 50%  Baseline:  Goal status: INITIAL   LONG TERM GOALS: Target date: 09/08/2022  MMT to have improved by 1 grade in all weak groups  Baseline:  Goal status: INITIAL  2.  Will be able to hike at least 10 miles without increase in pain L hip  Baseline:  Goal status: INITIAL  3.  Numbness and edema L hip and thigh to have completely resolved  Baseline:  Goal status: INITIAL  4.  FOTO score to have improved to goal level by time of DC to show subjective functional improvement  Baseline:  Goal status: INITIAL  PLAN:  PT FREQUENCY: 2x/week  PT DURATION: 6 weeks  PLANNED INTERVENTIONS: Therapeutic exercises, Therapeutic activity, Gait training, Patient/Family education, Self Care, Joint mobilization, Aquatic Therapy, Dry Needling, Cryotherapy, Moist heat, Taping, Ionotophoresis 4mg /ml Dexamethasone, Manual therapy, and Re-evaluation  PLAN FOR NEXT SESSION: functional strengthening for weak groups, flexibility work for muscles in spasm- consider DN to proximal quad/hip flexor? Trial MET for leg length difference vs placing trial heel lift   Marvell Fuller, PTA 08/04/22 4:22 PM

## 2022-08-09 ENCOUNTER — Encounter: Payer: Medicare HMO | Admitting: Physical Therapy

## 2022-08-10 ENCOUNTER — Encounter: Payer: Self-pay | Admitting: Physical Therapy

## 2022-08-10 ENCOUNTER — Ambulatory Visit: Payer: Medicare HMO | Attending: Student | Admitting: Physical Therapy

## 2022-08-10 DIAGNOSIS — M25552 Pain in left hip: Secondary | ICD-10-CM | POA: Diagnosis not present

## 2022-08-10 DIAGNOSIS — M6281 Muscle weakness (generalized): Secondary | ICD-10-CM | POA: Diagnosis not present

## 2022-08-10 DIAGNOSIS — R29898 Other symptoms and signs involving the musculoskeletal system: Secondary | ICD-10-CM | POA: Diagnosis not present

## 2022-08-10 NOTE — Therapy (Signed)
OUTPATIENT PHYSICAL THERAPY LOWER EXTREMITY TREATMENT   Patient Name: Carol Hoover MRN: [id number] DOB:1950/07/03, 72 y.o., female Today's Date: 08/10/2022  END OF SESSION:  PT End of Session - 08/10/22 1430     Visit Number 4    Number of Visits 13    Date for PT Re-Evaluation 09/08/22    Authorization Type [employer]    Authorization Time Period 07/28/22 to 09/08/22    Progress Note Due on Visit 10    PT Start Time 0145    PT Stop Time 0240    PT Time Calculation (min) 55 min    Activity Tolerance Patient tolerated treatment well    Behavior During Therapy [facility] for tasks assessed/performed            Past Medical History:  Diagnosis Date   Arthritis    Trigger finger    Past Surgical History:  Procedure Laterality Date   TUBAL LIGATION     Patient Active Problem List   Diagnosis Date Noted   Osteoarthritis of knee 12/19/2018   History of deep vein thrombosis 12/19/2018   Acute deep vein thrombosis (DVT) of popliteal vein of left lower extremity (HCC) 04/25/2018   Acute deep vein thrombosis (DVT) of left peroneal vein (HCC) 04/25/2018   Well female exam with routine gynecological exam 03/22/2016   PCP: Betsey Holiday PA-C   REFERRING PROVIDER: Derenda Fennel, PA  REFERRING DIAG: [phone] (ICD-10-CM) - Pain in right knee  THERAPY DIAG:  Pain in left hip  Muscle weakness (generalized)  Other symptoms and signs involving the musculoskeletal system  Rationale for Evaluation and Treatment: Rehabilitation  ONSET DATE:  6 weeks ago   SUBJECTIVE:   SUBJECTIVE STATEMENT: Sore from a 10 mile hike on Monday.  Used SIJ belt and it helped.  PERTINENT HISTORY: OA, hx DVT, hx TKR   PAIN:  Are you having pain? "Sore."   PRECAUTIONS: None  WEIGHT BEARING RESTRICTIONS: No  FALLS:  Has patient fallen in last 6 months? Yes. Number of falls 1- tripped on root when I fell   PATIENT GOALS: get back to long distance hiking   NEXT MD VISIT: Referring in a  couple weeks   OBJECTIVE:   DIAGNOSTIC FINDINGS:   PATIENT SURVEYS:  FOTO 66, predicted 76 in 11 visits   COGNITION: Overall cognitive status: Within functional limits for tasks assessed     SENSATION: On and off numbness starting proximal thigh and going down to knee   EDEMA: Reports on and off edema in thigh  MUSCLE LENGTH: Hip flexors mod limitation R quad mild limitation, L quad moderate limitation  POSTURE: RLE about 1/4 inch longer than L LE   PALPATION: Lumbar area tight but not tender, L glute tight but not tender, TFL not tender, lateral thigh and proximal quad very tight and tender   LOWER EXTREMITY ROM:  Passive ROM Right eval Left eval  Hip flexion WNL  WNL   Hip extension  Moderate limitation   Hip abduction  WNL   Hip adduction    Hip internal rotation  WNL   Hip external rotation  WNL   Knee flexion    Knee extension    Ankle dorsiflexion    Ankle plantarflexion    Ankle inversion    Ankle eversion     (Blank rows = not tested)  LOWER EXTREMITY MMT:  MMT Right eval Left eval  Hip flexion 3 3  Hip extension 3+ 3+  Hip abduction 4+ 4+  Hip adduction    Hip internal rotation    Hip external rotation    Knee flexion 4+ 4+  Knee extension 4+ 4+  Ankle dorsiflexion 5 5  Ankle plantarflexion    Ankle inversion    Ankle eversion     (Blank rows = not tested)  HIP SPECIAL TESTS Scour test negative FABER test negative   TODAY'S TREATMENT:                                                                                                                              DATE:    08/04/22 EXERCISE LOG  Exercise Repetitions and Resistance Comments  Bike L4 x8 min                               \Trigger Point Dry-Needling  Treatment instructions: Expect mild to moderate muscle soreness. S/S of pneumothorax if dry needled over a lung field, and to seek immediate medical attention should they occur. Patient verbalized understanding of these  instructions and education. Patient Consent Given: Yes Education handout provided: Yes Muscles treated: Left upper glut F/b U/S at 1.50 W/CM2 x 8 minutes f/b STW/M x 8 minutes f/b HMP and HMP and IFC at 80-150 Hz on 40% scan x  15 minutes.  Normal modality resposne following removal of modality.  PATIENT EDUCATION:  Education details: exam findings, POC, HEP  Person educated: Patient Education method: Explanation, Demonstration, and Handouts Education comprehension: verbalized understanding, returned demonstration, and needs further education  HOME EXERCISE PROGRAM: Access Code: [facility] URL: [email or web address] Date: 07/28/2022 Prepared by: Nedra Hai  Exercises - Single Leg Bridge  - 2 x daily - 7 x weekly - 1 sets - 10 reps - 1 hold - Supine Quadriceps Stretch with Strap on Table  - 2 x daily - 7 x weekly - 1 sets - 3 reps - 30 hold - Seated March with Resistance  - 2 x daily - 7 x weekly - 1 sets - 10 reps - 1 hold - Quadruped Hip Abduction with Resistance Loop  - 2 x daily - 7 x weekly - 1 sets - 10 reps - 1 hold  ASSESSMENT:  CLINICAL IMPRESSION: Patient doing well.  Sore from a 10 mile hike on Monday.  Used SIJ which was helped.  DN to left upper gluteal musculature was tolerated without complaint and she felt good after treatment.  OBJECTIVE IMPAIRMENTS: Abnormal gait, difficulty walking, decreased ROM, decreased strength, increased edema, increased muscle spasms, impaired flexibility, and pain.   ACTIVITY LIMITATIONS: squatting and locomotion level  PARTICIPATION LIMITATIONS: community activity  PERSONAL FACTORS: Fitness, Past/current experiences, and Time since onset of injury/illness/exacerbation are also affecting patient's functional outcome.   REHAB POTENTIAL: Excellent  CLINICAL DECISION MAKING: Stable/uncomplicated  EVALUATION COMPLEXITY: Low  GOALS: Goals reviewed with patient? Yes  SHORT TERM GOALS: Target date: 08/18/2022   Will be  compliant with appropriate  progressive HEP  Baseline: Goal status: INITIAL  2.  Frequency of numbness and edema L hip/thigh to have improved by 50%  Baseline:  Goal status: INITIAL  3.  Pain in L hip to be no more than 1/10 at worst with challenging activities  Baseline:  Goal status: INITIAL  4.  Quad and hip flexor flexibility limitations to have improved by 50%  Baseline:  Goal status: INITIAL   LONG TERM GOALS: Target date: 09/08/2022  MMT to have improved by 1 grade in all weak groups  Baseline:  Goal status: INITIAL  2.  Will be able to hike at least 10 miles without increase in pain L hip  Baseline:  Goal status: INITIAL  3.  Numbness and edema L hip and thigh to have completely resolved  Baseline:  Goal status: INITIAL  4.  FOTO score to have improved to goal level by time of DC to show subjective functional improvement  Baseline:  Goal status: INITIAL  PLAN:  PT FREQUENCY: 2x/week  PT DURATION: 6 weeks  PLANNED INTERVENTIONS: Therapeutic exercises, Therapeutic activity, Gait training, Patient/Family education, Self Care, Joint mobilization, Aquatic Therapy, Dry Needling, Cryotherapy, Moist heat, Taping, Ionotophoresis 4mg /ml Dexamethasone, Manual therapy, and Re-evaluation  PLAN FOR NEXT SESSION: functional strengthening for weak groups, flexibility work for muscles in spasm- consider DN to proximal quad/hip flexor? Trial MET for leg length difference vs placing trial heel lift   Marvell Fuller, PTA 08/10/22 2:46 PM

## 2022-08-11 ENCOUNTER — Ambulatory Visit: Payer: Medicare HMO | Admitting: Physical Therapy

## 2022-08-11 DIAGNOSIS — R29898 Other symptoms and signs involving the musculoskeletal system: Secondary | ICD-10-CM

## 2022-08-11 DIAGNOSIS — M6281 Muscle weakness (generalized): Secondary | ICD-10-CM | POA: Diagnosis not present

## 2022-08-11 DIAGNOSIS — M25552 Pain in left hip: Secondary | ICD-10-CM | POA: Diagnosis not present

## 2022-08-11 NOTE — Therapy (Signed)
OUTPATIENT PHYSICAL THERAPY LOWER EXTREMITY TREATMENT   Patient Name: Carol Hoover MRN: [id number] DOB:Jan 01, 1951, 72 y.o., female Today's Date: 08/11/2022  END OF SESSION:  PT End of Session - 08/11/22 1656     Visit Number 5    Number of Visits 13    Date for PT Re-Evaluation 09/08/22    Authorization Type [employer]    Authorization Time Period 07/28/22 to 09/08/22    PT Start Time 0316    PT Stop Time 0404    PT Time Calculation (min) 48 min    Activity Tolerance Patient tolerated treatment well    Behavior During Therapy [facility] for tasks assessed/performed            Past Medical History:  Diagnosis Date   Arthritis    Trigger finger    Past Surgical History:  Procedure Laterality Date   TUBAL LIGATION     Patient Active Problem List   Diagnosis Date Noted   Osteoarthritis of knee 12/19/2018   History of deep vein thrombosis 12/19/2018   Acute deep vein thrombosis (DVT) of popliteal vein of left lower extremity (HCC) 04/25/2018   Acute deep vein thrombosis (DVT) of left peroneal vein (HCC) 04/25/2018   Well female exam with routine gynecological exam 03/22/2016   PCP: Betsey Holiday PA-C   REFERRING PROVIDER: Derenda Fennel, PA  REFERRING DIAG: [phone] (ICD-10-CM) - Pain in right knee  THERAPY DIAG:  Pain in left hip  Muscle weakness (generalized)  Other symptoms and signs involving the musculoskeletal system  Rationale for Evaluation and Treatment: Rehabilitation  ONSET DATE:  6 weeks ago   SUBJECTIVE:   SUBJECTIVE STATEMENT: Did well with dry needling.  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  PERTINENT HISTORY: OA, hx DVT, hx TKR   PAIN:  Are you having pain? "Sore."   PRECAUTIONS: None  WEIGHT BEARING RESTRICTIONS: No  FALLS:  Has patient fallen in last 6 months? Yes. Number of falls 1- tripped on root when I fell   PATIENT GOALS: get back to long distance hiking   NEXT MD VISIT: Referring in a couple  weeks   OBJECTIVE:   DIAGNOSTIC FINDINGS:   PATIENT SURVEYS:  FOTO 66, predicted 76 in 11 visits   COGNITION: Overall cognitive status: Within functional limits for tasks assessed     SENSATION: On and off numbness starting proximal thigh and going down to knee   EDEMA: Reports on and off edema in thigh  MUSCLE LENGTH: Hip flexors mod limitation R quad mild limitation, L quad moderate limitation  POSTURE: RLE about 1/4 inch longer than L LE   PALPATION: Lumbar area tight but not tender, L glute tight but not tender, TFL not tender, lateral thigh and proximal quad very tight and tender   LOWER EXTREMITY ROM:  Passive ROM Right eval Left eval  Hip flexion WNL  WNL   Hip extension  Moderate limitation   Hip abduction  WNL   Hip adduction    Hip internal rotation  WNL   Hip external rotation  WNL   Knee flexion    Knee extension    Ankle dorsiflexion    Ankle plantarflexion    Ankle inversion    Ankle eversion     (Blank rows = not tested)  LOWER EXTREMITY MMT:  MMT Right eval Left eval  Hip flexion 3 3  Hip extension 3+ 3+  Hip abduction 4+ 4+  Hip adduction  Hip internal rotation    Hip external rotation    Knee flexion 4+ 4+  Knee extension 4+ 4+  Ankle dorsiflexion 5 5  Ankle plantarflexion    Ankle inversion    Ankle eversion     (Blank rows = not tested)  HIP SPECIAL TESTS Scour test negative FABER test negative   TODAY'S TREATMENT:                                                                                                                              DATE:    08/04/22 EXERCISE LOG  Exercise Repetitions and Resistance Comments  Bike L4 x 9 min                                F/b Combo/U/S at 1.50 W/CM2 x 8 minutes to left upper gluteal musculature f/b STW/M x 9 minutes f/b HMP and IFC at 80-150 Hz on 40% scan x 20 minutes.  Normal modality resposne following removal of modality.  PATIENT EDUCATION:  Education details: exam  findings, POC, HEP  Person educated: Patient Education method: Programmer, multimedia, Demonstration, and Handouts Education comprehension: verbalized understanding, returned demonstration, and needs further education  HOME EXERCISE PROGRAM: Demonstrated S and DKTC, mini-crunches, hip bridges and left hip extension stretch (to do when flared-up)  ASSESSMENT:  CLINICAL IMPRESSION: Patient did well with dry needling.  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  She has some continued tenderness over her left upper gluteal musculature and Piriformis today.  Added to HEP as above.  OBJECTIVE IMPAIRMENTS: Abnormal gait, difficulty walking, decreased ROM, decreased strength, increased edema, increased muscle spasms, impaired flexibility, and pain.   ACTIVITY LIMITATIONS: squatting and locomotion level  PARTICIPATION LIMITATIONS: community activity  PERSONAL FACTORS: Fitness, Past/current experiences, and Time since onset of injury/illness/exacerbation are also affecting patient's functional outcome.   REHAB POTENTIAL: Excellent  CLINICAL DECISION MAKING: Stable/uncomplicated  EVALUATION COMPLEXITY: Low  GOALS: Goals reviewed with patient? Yes  SHORT TERM GOALS: Target date: 08/18/2022   Will be compliant with appropriate progressive HEP  Baseline: Goal status: INITIAL  2.  Frequency of numbness and edema L hip/thigh to have improved by 50%  Baseline:  Goal status: INITIAL  3.  Pain in L hip to be no more than 1/10 at worst with challenging activities  Baseline:  Goal status: INITIAL  4.  Quad and hip flexor flexibility limitations to have improved by 50%  Baseline:  Goal status: INITIAL   LONG TERM GOALS: Target date: 09/08/2022  MMT to have improved by 1 grade in all weak groups  Baseline:  Goal status: INITIAL  2.  Will be able to hike at least 10 miles without increase in pain L hip  Baseline:  Goal status: INITIAL  3.  Numbness and edema L  hip and thigh to have completely resolved  Baseline:  Goal status: INITIAL  4.  FOTO score to have improved to goal level by time of DC to show subjective functional improvement  Baseline:  Goal status: INITIAL  PLAN:  PT FREQUENCY: 2x/week  PT DURATION: 6 weeks  PLANNED INTERVENTIONS: Therapeutic exercises, Therapeutic activity, Gait training, Patient/Family education, Self Care, Joint mobilization, Aquatic Therapy, Dry Needling, Cryotherapy, Moist heat, Taping, Ionotophoresis 4mg /ml Dexamethasone, Manual therapy, and Re-evaluation  PLAN FOR NEXT SESSION: functional strengthening for weak groups, flexibility work for muscles in spasm- consider DN to proximal quad/hip flexor? Trial MET for leg length difference vs placing trial heel lift   Marvell Fuller, PTA 08/11/22 5:13 PM

## 2022-08-17 ENCOUNTER — Ambulatory Visit: Payer: Medicare HMO | Admitting: Physical Therapy

## 2022-08-17 DIAGNOSIS — M25552 Pain in left hip: Secondary | ICD-10-CM

## 2022-08-17 DIAGNOSIS — M6281 Muscle weakness (generalized): Secondary | ICD-10-CM

## 2022-08-17 DIAGNOSIS — R29898 Other symptoms and signs involving the musculoskeletal system: Secondary | ICD-10-CM

## 2022-08-17 NOTE — Therapy (Signed)
OUTPATIENT PHYSICAL THERAPY LOWER EXTREMITY TREATMENT   Patient Name: Carol Hoover MRN: [id number] DOB:Jun 24, 1950, 72 y.o., female Today's Date: 08/17/2022  END OF SESSION:  PT End of Session - 08/17/22 0842     Visit Number 6    Number of Visits 13    Date for PT Re-Evaluation 09/08/22    PT Start Time 0800    PT Stop Time 0858    PT Time Calculation (min) 58 min    Activity Tolerance Patient tolerated treatment well    Behavior During Therapy [facility] for tasks assessed/performed            Past Medical History:  Diagnosis Date   Arthritis    Trigger finger    Past Surgical History:  Procedure Laterality Date   TUBAL LIGATION     Patient Active Problem List   Diagnosis Date Noted   Osteoarthritis of knee 12/19/2018   History of deep vein thrombosis 12/19/2018   Acute deep vein thrombosis (DVT) of popliteal vein of left lower extremity (HCC) 04/25/2018   Acute deep vein thrombosis (DVT) of left peroneal vein (HCC) 04/25/2018   Well female exam with routine gynecological exam 03/22/2016   PCP: Betsey Holiday PA-C   REFERRING PROVIDER: Derenda Fennel, PA  REFERRING DIAG: [phone] (ICD-10-CM) - Pain in right knee  THERAPY DIAG:  Pain in left hip  Muscle weakness (generalized)  Other symptoms and signs involving the musculoskeletal system  Rationale for Evaluation and Treatment: Rehabilitation  ONSET DATE:  6 weeks ago   SUBJECTIVE:   SUBJECTIVE STATEMENT: Did an 11 mile hike on Monday.  Sore but did well.  Used SIJ belt.  PERTINENT HISTORY: OA, hx DVT, hx TKR   PAIN:  Are you having pain? "Sore."   PRECAUTIONS: None  WEIGHT BEARING RESTRICTIONS: No  FALLS:  Has patient fallen in last 6 months? Yes. Number of falls 1- tripped on root when I fell   PATIENT GOALS: get back to long distance hiking   NEXT MD VISIT: Referring in a couple weeks   OBJECTIVE:   DIAGNOSTIC FINDINGS:   PATIENT SURVEYS:  FOTO 66, predicted 76 in 11 visits    COGNITION: Overall cognitive status: Within functional limits for tasks assessed     SENSATION: On and off numbness starting proximal thigh and going down to knee   EDEMA: Reports on and off edema in thigh  MUSCLE LENGTH: Hip flexors mod limitation R quad mild limitation, L quad moderate limitation  POSTURE: RLE about 1/4 inch longer than L LE   PALPATION: Lumbar area tight but not tender, L glute tight but not tender, TFL not tender, lateral thigh and proximal quad very tight and tender   LOWER EXTREMITY ROM:  Passive ROM Right eval Left eval  Hip flexion WNL  WNL   Hip extension  Moderate limitation   Hip abduction  WNL   Hip adduction    Hip internal rotation  WNL   Hip external rotation  WNL   Knee flexion    Knee extension    Ankle dorsiflexion    Ankle plantarflexion    Ankle inversion    Ankle eversion     (Blank rows = not tested)  LOWER EXTREMITY MMT:  MMT Right eval Left eval  Hip flexion 3 3  Hip extension 3+ 3+  Hip abduction 4+ 4+  Hip adduction    Hip internal rotation    Hip external rotation    Knee flexion 4+ 4+  Knee  extension 4+ 4+  Ankle dorsiflexion 5 5  Ankle plantarflexion    Ankle inversion    Ankle eversion     (Blank rows = not tested)  HIP SPECIAL TESTS Scour test negative FABER test negative   TODAY'S TREATMENT:                                                                                                                              DATE:    08/04/22 EXERCISE LOG  Exercise Repetitions and Resistance Comments  Elliptical L3/L3 x 10 minutes (5 minutes forward and 5 minutes backward)                               DN to left upper gout/lat hip/TFL musculature  f/b STW/M x 13 minutes f/b HMP and IFC at 80-150 Hz on 40% scan x 20 minutes. Normal modality response following removal of modality.  PATIENT EDUCATION:  Education details: Reviewed left hip stretch into extension to be used if she has a flare-up preceded by  an adductor squeeze. Person educated:  Education method:  Education comprehension:   HOME EXERCISE PROGRAM: Demonstrated S and DKTC, mini-crunches, hip bridges and left hip extension stretch (to do when flared-up)  ASSESSMENT:  CLINICAL IMPRESSION: Patient doing well.  A bit sore from an 11 mile hike Monday but other than that doing very well.  She has responded well to dry needling.  She has alos found the use of an SIJ belt very helpful.  OBJECTIVE IMPAIRMENTS: Abnormal gait, difficulty walking, decreased ROM, decreased strength, increased edema, increased muscle spasms, impaired flexibility, and pain.   ACTIVITY LIMITATIONS: squatting and locomotion level  PARTICIPATION LIMITATIONS: community activity  PERSONAL FACTORS: Fitness, Past/current experiences, and Time since onset of injury/illness/exacerbation are also affecting patient's functional outcome.   REHAB POTENTIAL: Excellent  CLINICAL DECISION MAKING: Stable/uncomplicated  EVALUATION COMPLEXITY: Low  GOALS: Goals reviewed with patient? Yes  SHORT TERM GOALS: Target date: 08/18/2022   Will be compliant with appropriate progressive HEP  Baseline: Goal status: INITIAL  2.  Frequency of numbness and edema L hip/thigh to have improved by 50%  Baseline:  Goal status: INITIAL  3.  Pain in L hip to be no more than 1/10 at worst with challenging activities  Baseline:  Goal status: INITIAL  4.  Quad and hip flexor flexibility limitations to have improved by 50%  Baseline:  Goal status: INITIAL   LONG TERM GOALS: Target date: 09/08/2022  MMT to have improved by 1 grade in all weak groups  Baseline:  Goal status: INITIAL  2.  Will be able to hike at least 10 miles without increase in pain L hip  Baseline:  Goal status: INITIAL  3.  Numbness and edema L hip and thigh to have completely resolved  Baseline:  Goal status: INITIAL  4.  FOTO score to have improved to goal level by time  of DC to show subjective  functional improvement  Baseline:  Goal status: INITIAL  PLAN:  PT FREQUENCY: 2x/week  PT DURATION: 6 weeks  PLANNED INTERVENTIONS: Therapeutic exercises, Therapeutic activity, Gait training, Patient/Family education, Self Care, Joint mobilization, Aquatic Therapy, Dry Needling, Cryotherapy, Moist heat, Taping, Ionotophoresis 4mg /ml Dexamethasone, Manual therapy, and Re-evaluation  PLAN FOR NEXT SESSION: functional strengthening for weak groups, flexibility work for muscles in spasm- consider DN to proximal quad/hip flexor? Trial MET for leg length difference vs placing trial heel lift   [place] Kelsay Haggard MPT

## 2022-08-18 ENCOUNTER — Encounter: Payer: Self-pay | Admitting: *Deleted

## 2022-08-18 ENCOUNTER — Ambulatory Visit: Payer: Medicare HMO | Admitting: *Deleted

## 2022-08-18 DIAGNOSIS — M6281 Muscle weakness (generalized): Secondary | ICD-10-CM

## 2022-08-18 DIAGNOSIS — M25552 Pain in left hip: Secondary | ICD-10-CM | POA: Diagnosis not present

## 2022-08-18 DIAGNOSIS — R29898 Other symptoms and signs involving the musculoskeletal system: Secondary | ICD-10-CM | POA: Diagnosis not present

## 2022-08-18 NOTE — Therapy (Signed)
OUTPATIENT PHYSICAL THERAPY LOWER EXTREMITY TREATMENT   Patient Name: Carol Hoover MRN: [id number] DOB:02/02/1951, 72 y.o., female Today's Date: 08/18/2022  END OF SESSION:  PT End of Session - 08/18/22 1602     Visit Number 7    Number of Visits 13    Date for PT Re-Evaluation 09/08/22    Authorization Type [employer]    Authorization Time Period 07/28/22 to 09/08/22    Progress Note Due on Visit 10    PT Start Time 1602    PT Stop Time 1648    PT Time Calculation (min) 46 min            Past Medical History:  Diagnosis Date   Arthritis    Trigger finger    Past Surgical History:  Procedure Laterality Date   TUBAL LIGATION     Patient Active Problem List   Diagnosis Date Noted   Osteoarthritis of knee 12/19/2018   History of deep vein thrombosis 12/19/2018   Acute deep vein thrombosis (DVT) of popliteal vein of left lower extremity (HCC) 04/25/2018   Acute deep vein thrombosis (DVT) of left peroneal vein (HCC) 04/25/2018   Well female exam with routine gynecological exam 03/22/2016   PCP: Betsey Holiday PA-C   REFERRING PROVIDER: Derenda Fennel, PA  REFERRING DIAG: [phone] (ICD-10-CM) - Pain in right knee  THERAPY DIAG:  Pain in left hip  Muscle weakness (generalized)  Other symptoms and signs involving the musculoskeletal system  Rationale for Evaluation and Treatment: Rehabilitation  ONSET DATE:  6 weeks ago   SUBJECTIVE:   SUBJECTIVE STATEMENT: Did great after  last Rx and SIJ belt really helps during hike  PERTINENT HISTORY: OA, hx DVT, hx TKR   PAIN:  Are you having pain? "Sore."   PRECAUTIONS: None  WEIGHT BEARING RESTRICTIONS: No  FALLS:  Has patient fallen in last 6 months? Yes. Number of falls 1- tripped on root when I fell   PATIENT GOALS: get back to long distance hiking   NEXT MD VISIT: Referring in a couple weeks   OBJECTIVE:   DIAGNOSTIC FINDINGS:   PATIENT SURVEYS:  FOTO 66, predicted 76 in 11 visits    COGNITION: Overall cognitive status: Within functional limits for tasks assessed     SENSATION: On and off numbness starting proximal thigh and going down to knee   EDEMA: Reports on and off edema in thigh  MUSCLE LENGTH: Hip flexors mod limitation R quad mild limitation, L quad moderate limitation  POSTURE: RLE about 1/4 inch longer than L LE   PALPATION: Lumbar area tight but not tender, L glute tight but not tender, TFL not tender, lateral thigh and proximal quad very tight and tender   LOWER EXTREMITY ROM:  Passive ROM Right eval Left eval  Hip flexion WNL  WNL   Hip extension  Moderate limitation   Hip abduction  WNL   Hip adduction    Hip internal rotation  WNL   Hip external rotation  WNL   Knee flexion    Knee extension    Ankle dorsiflexion    Ankle plantarflexion    Ankle inversion    Ankle eversion     (Blank rows = not tested)  LOWER EXTREMITY MMT:  MMT Right eval Left eval  Hip flexion 3 3  Hip extension 3+ 3+  Hip abduction 4+ 4+  Hip adduction    Hip internal rotation    Hip external rotation    Knee flexion 4+ 4+  Knee extension 4+ 4+  Ankle dorsiflexion 5 5  Ankle plantarflexion    Ankle inversion    Ankle eversion     (Blank rows = not tested)  HIP SPECIAL TESTS Scour test negative FABER test negative   TODAY'S TREATMENT:                                                                                                                              DATE:                                                        08-18-22                            EXERCISE LOG  Exercise Repetitions and Resistance Comments  Elliptical L5/L5 x 10 minutes (5 minutes forward and 5 minutes backward)                                STW/M x 13 minutes to RT SIJ, LB paras and glute  HMP and IFC at 80-150 Hz on 40% scan x 20 minutes. Normal modality response following removal of modality.  PATIENT EDUCATION:  Education details: Reviewed left hip stretch  into extension to be used if she has a flare-up preceded by an adductor squeeze. Person educated:  Education method:  Education comprehension:   HOME EXERCISE PROGRAM: Demonstrated S and DKTC, mini-crunches, hip bridges and left hip extension stretch (to do when flared-up)  ASSESSMENT:  CLINICAL IMPRESSION: Patient doing well still with  some soreness along LT SIJ area and glute. She did well with Rx today and reports doing much better since atarting PT and was able to meet all STG's today.   OBJECTIVE IMPAIRMENTS: Abnormal gait, difficulty walking, decreased ROM, decreased strength, increased edema, increased muscle spasms, impaired flexibility, and pain.   ACTIVITY LIMITATIONS: squatting and locomotion level  PARTICIPATION LIMITATIONS: community activity  PERSONAL FACTORS: Fitness, Past/current experiences, and Time since onset of injury/illness/exacerbation are also affecting patient's functional outcome.   REHAB POTENTIAL: Excellent  CLINICAL DECISION MAKING: Stable/uncomplicated  EVALUATION COMPLEXITY: Low  GOALS: Goals reviewed with patient? Yes  SHORT TERM GOALS: Target date: 08/18/2022   Will be compliant with appropriate progressive HEP  Baseline: Goal status: MET  2.  Frequency of numbness and edema L hip/thigh to have improved by 50%  Baseline:  Goal status: MET  3.  Pain in L hip to be no more than 1/10 at worst with challenging activities  Baseline:  Goal status: MET  4.  Quad and hip flexor flexibility limitations to have improved by 50%  Baseline:  Goal status: MET   LONG TERM GOALS: Target date: 09/08/2022  MMT to have improved by 1 grade in all weak groups  Baseline: on going Goal status: 2.  Will be able to hike at least 10 miles without increase in pain L hip  Baseline:  Goal status: on going  3.  Numbness and edema L hip and thigh to have completely resolved  Baseline:  Goal status: on going  4.  FOTO score to have improved to goal level  by time of DC to show subjective functional improvement  Baseline:  Goal status: on going  PLAN:  PT FREQUENCY: 2x/week  PT DURATION: 6 weeks  PLANNED INTERVENTIONS: Therapeutic exercises, Therapeutic activity, Gait training, Patient/Family education, Self Care, Joint mobilization, Aquatic Therapy, Dry Needling, Cryotherapy, Moist heat, Taping, Ionotophoresis 4mg /ml Dexamethasone, Manual therapy, and Re-evaluation  PLAN FOR NEXT SESSION: functional strengthening for weak groups, flexibility work for muscles in spasm- consider   [place] Applegate MPT

## 2022-08-19 DIAGNOSIS — N951 Menopausal and female climacteric states: Secondary | ICD-10-CM | POA: Diagnosis not present

## 2022-08-19 DIAGNOSIS — E039 Hypothyroidism, unspecified: Secondary | ICD-10-CM | POA: Diagnosis not present

## 2022-08-24 ENCOUNTER — Ambulatory Visit: Payer: Medicare HMO | Admitting: Physical Therapy

## 2022-08-24 DIAGNOSIS — G479 Sleep disorder, unspecified: Secondary | ICD-10-CM | POA: Diagnosis not present

## 2022-08-24 DIAGNOSIS — M25552 Pain in left hip: Secondary | ICD-10-CM

## 2022-08-24 DIAGNOSIS — R6882 Decreased libido: Secondary | ICD-10-CM | POA: Diagnosis not present

## 2022-08-24 DIAGNOSIS — Z79890 Hormone replacement therapy (postmenopausal): Secondary | ICD-10-CM | POA: Diagnosis not present

## 2022-08-24 DIAGNOSIS — E039 Hypothyroidism, unspecified: Secondary | ICD-10-CM | POA: Diagnosis not present

## 2022-08-24 DIAGNOSIS — R5383 Other fatigue: Secondary | ICD-10-CM | POA: Diagnosis not present

## 2022-08-24 DIAGNOSIS — M6281 Muscle weakness (generalized): Secondary | ICD-10-CM | POA: Diagnosis not present

## 2022-08-24 DIAGNOSIS — N951 Menopausal and female climacteric states: Secondary | ICD-10-CM | POA: Diagnosis not present

## 2022-08-24 DIAGNOSIS — R29898 Other symptoms and signs involving the musculoskeletal system: Secondary | ICD-10-CM | POA: Diagnosis not present

## 2022-08-24 DIAGNOSIS — M2550 Pain in unspecified joint: Secondary | ICD-10-CM | POA: Diagnosis not present

## 2022-08-24 DIAGNOSIS — R232 Flushing: Secondary | ICD-10-CM | POA: Diagnosis not present

## 2022-08-24 DIAGNOSIS — N898 Other specified noninflammatory disorders of vagina: Secondary | ICD-10-CM | POA: Diagnosis not present

## 2022-08-24 DIAGNOSIS — Z6822 Body mass index (BMI) 22.0-22.9, adult: Secondary | ICD-10-CM | POA: Diagnosis not present

## 2022-08-24 NOTE — Therapy (Signed)
OUTPATIENT PHYSICAL THERAPY LOWER EXTREMITY TREATMENT   Patient Name: Carol Hoover MRN: [id number] DOB:16-Feb-1951, 72 y.o., female Today's Date: 08/24/2022  END OF SESSION:  PT End of Session - 08/24/22 0922     Visit Number 8    Number of Visits 13    Date for PT Re-Evaluation 09/08/22    PT Start Time 0800    PT Stop Time 0858    PT Time Calculation (min) 58 min    Activity Tolerance Patient tolerated treatment well    Behavior During Therapy [facility] for tasks assessed/performed            Past Medical History:  Diagnosis Date   Arthritis    Trigger finger    Past Surgical History:  Procedure Laterality Date   TUBAL LIGATION     Patient Active Problem List   Diagnosis Date Noted   Osteoarthritis of knee 12/19/2018   History of deep vein thrombosis 12/19/2018   Acute deep vein thrombosis (DVT) of popliteal vein of left lower extremity (HCC) 04/25/2018   Acute deep vein thrombosis (DVT) of left peroneal vein (HCC) 04/25/2018   Well female exam with routine gynecological exam 03/22/2016   PCP: Betsey Holiday PA-C   REFERRING PROVIDER: Derenda Fennel, PA  REFERRING DIAG: [phone] (ICD-10-CM) - Pain in right knee  THERAPY DIAG:  Pain in left hip  Rationale for Evaluation and Treatment: Rehabilitation  ONSET DATE:  6 weeks ago   SUBJECTIVE:   SUBJECTIVE STATEMENT: [place] in [place] this [place] a [place] for a total of about 30 minutes and sleeping in [place].  Left hip did great and used SIJ belt.  Sore.  PERTINENT HISTORY: OA, hx DVT, hx TKR   PAIN:  Are you having pain? "Sore."   PRECAUTIONS: None  WEIGHT BEARING RESTRICTIONS: No  FALLS:  Has patient fallen in last 6 months? Yes. Number of falls 1- tripped on root when I fell   PATIENT GOALS: get back to long distance hiking   NEXT MD VISIT: Referring in a couple weeks   OBJECTIVE:   DIAGNOSTIC FINDINGS:   PATIENT SURVEYS:  FOTO 66, predicted 76 in 11 visits   COGNITION: Overall cognitive  status: Within functional limits for tasks assessed     SENSATION: On and off numbness starting proximal thigh and going down to knee   EDEMA: Reports on and off edema in thigh  MUSCLE LENGTH: Hip flexors mod limitation R quad mild limitation, L quad moderate limitation  POSTURE: RLE about 1/4 inch longer than L LE   PALPATION: Lumbar area tight but not tender, L glute tight but not tender, TFL not tender, lateral thigh and proximal quad very tight and tender   LOWER EXTREMITY ROM:  Passive ROM Right eval Left eval  Hip flexion WNL  WNL   Hip extension  Moderate limitation   Hip abduction  WNL   Hip adduction    Hip internal rotation  WNL   Hip external rotation  WNL   Knee flexion    Knee extension    Ankle dorsiflexion    Ankle plantarflexion    Ankle inversion    Ankle eversion     (Blank rows = not tested)  LOWER EXTREMITY MMT:  MMT Right eval Left eval  Hip flexion 3 3  Hip extension 3+ 3+  Hip abduction 4+ 4+  Hip adduction    Hip internal rotation    Hip external rotation    Knee flexion 4+ 4+  Knee extension 4+ 4+  Ankle dorsiflexion 5 5  Ankle plantarflexion    Ankle inversion    Ankle eversion     (Blank rows = not tested)  HIP SPECIAL TESTS Scour test negative FABER test negative   TODAY'S TREATMENT:                                                                                                                              DATE:                     08/24/22.                             [place] at 1.50 W/CM2 x 12 minutes f/b STW/M x 12 minutes to RT SIJ, LB paras and glute  HMP and IFC at 80-150 Hz on 40% scan x 20 minutes. Normal modality response following removal of modality.  PATIENT EDUCATION:  Education details: Reviewed left hip stretch into extension to be used if she has a flare-up preceded by an adductor squeeze. Person educated:  International aid/development worker:  Education comprehension:   HOME EXERCISE PROGRAM: Demonstrated S and DKTC,  mini-crunches, hip bridges and left hip extension stretch (to do when flared-up)  ASSESSMENT:  CLINICAL IMPRESSION: Patient was very happy to have hiked over the last couple of days with her left hip/SIJ doing very well.  She used her SIJ belt and that helped a lot.  Showed her a thera-cane that can be used for soft tissue/trigger point release. She has some palpable tenderness over her left posterior sacral iliac ligament but otherwise was without pain.   OBJECTIVE IMPAIRMENTS: Abnormal gait, difficulty walking, decreased ROM, decreased strength, increased edema, increased muscle spasms, impaired flexibility, and pain.   ACTIVITY LIMITATIONS: squatting and locomotion level  PARTICIPATION LIMITATIONS: community activity  PERSONAL FACTORS: Fitness, Past/current experiences, and Time since onset of injury/illness/exacerbation are also affecting patient's functional outcome.   REHAB POTENTIAL: Excellent  CLINICAL DECISION MAKING: Stable/uncomplicated  EVALUATION COMPLEXITY: Low  GOALS: Goals reviewed with patient? Yes  SHORT TERM GOALS: Target date: 08/18/2022   Will be compliant with appropriate progressive HEP  Baseline: Goal status: MET  2.  Frequency of numbness and edema L hip/thigh to have improved by 50%  Baseline:  Goal status: MET  3.  Pain in L hip to be no more than 1/10 at worst with challenging activities  Baseline:  Goal status: MET  4.  Quad and hip flexor flexibility limitations to have improved by 50%  Baseline:  Goal status: MET   LONG TERM GOALS: Target date: 09/08/2022  MMT to have improved by 1 grade in all weak groups  Baseline: on going Goal status: 2.  Will be able to hike at least 10 miles without increase in pain L hip  Baseline:  Goal status: on going  3.  Numbness and edema L hip and thigh  to have completely resolved  Baseline:  Goal status: on going  4.  FOTO score to have improved to goal level by time of DC to show subjective functional  improvement  Baseline:  Goal status: on going  PLAN:  PT FREQUENCY: 2x/week  PT DURATION: 6 weeks  PLANNED INTERVENTIONS: Therapeutic exercises, Therapeutic activity, Gait training, Patient/Family education, Self Care, Joint mobilization, Aquatic Therapy, Dry Needling, Cryotherapy, Moist heat, Taping, Ionotophoresis 4mg /ml Dexamethasone, Manual therapy, and Re-evaluation  PLAN FOR NEXT SESSION: functional strengthening for weak groups, flexibility work for muscles in spasm- consider   [place] Sharifah Champine MPT

## 2022-08-25 ENCOUNTER — Encounter: Payer: Medicare HMO | Admitting: *Deleted

## 2022-08-29 ENCOUNTER — Ambulatory Visit: Payer: Medicare HMO | Admitting: *Deleted

## 2022-08-29 DIAGNOSIS — M25552 Pain in left hip: Secondary | ICD-10-CM

## 2022-08-29 DIAGNOSIS — M6281 Muscle weakness (generalized): Secondary | ICD-10-CM

## 2022-08-29 DIAGNOSIS — R29898 Other symptoms and signs involving the musculoskeletal system: Secondary | ICD-10-CM

## 2022-08-29 NOTE — Therapy (Signed)
OUTPATIENT PHYSICAL THERAPY LOWER EXTREMITY TREATMENT   Patient Name: Carol Hoover MRN: [id number] DOB:03/27/1950, 72 y.o., female Today's Date: 08/29/2022  END OF SESSION:  PT End of Session - 08/29/22 1629     Visit Number 9    Number of Visits 13    Date for PT Re-Evaluation 09/08/22    Authorization Type [employer]    Authorization Time Period 07/28/22 to 09/08/22    PT Start Time 1430    PT Stop Time 1520    PT Time Calculation (min) 50 min             Past Medical History:  Diagnosis Date   Arthritis    Trigger finger    Past Surgical History:  Procedure Laterality Date   TUBAL LIGATION     Patient Active Problem List   Diagnosis Date Noted   Osteoarthritis of knee 12/19/2018   History of deep vein thrombosis 12/19/2018   Acute deep vein thrombosis (DVT) of popliteal vein of left lower extremity (HCC) 04/25/2018   Acute deep vein thrombosis (DVT) of left peroneal vein (HCC) 04/25/2018   Well female exam with routine gynecological exam 03/22/2016   PCP: Betsey Holiday PA-C   REFERRING PROVIDER: Derenda Fennel, PA  REFERRING DIAG: [phone] (ICD-10-CM) - Pain in right knee  THERAPY DIAG:  Pain in left hip  Muscle weakness (generalized)  Other symptoms and signs involving the musculoskeletal system  Rationale for Evaluation and Treatment: Rehabilitation  ONSET DATE:  6 weeks ago   SUBJECTIVE:   SUBJECTIVE STATEMENT: Doing great. Ready to DC  PERTINENT HISTORY: OA, hx DVT, hx TKR   PAIN:  Are you having pain? "Sore."   PRECAUTIONS: None  WEIGHT BEARING RESTRICTIONS: No  FALLS:  Has patient fallen in last 6 months? Yes. Number of falls 1- tripped on root when I fell   PATIENT GOALS: get back to long distance hiking   NEXT MD VISIT: Referring in a couple weeks   OBJECTIVE:   DIAGNOSTIC FINDINGS:   PATIENT SURVEYS:  FOTO 66, predicted 76 in 11 visits   COGNITION: Overall cognitive status: Within functional limits for tasks  assessed     SENSATION: On and off numbness starting proximal thigh and going down to knee   EDEMA: Reports on and off edema in thigh  MUSCLE LENGTH: Hip flexors mod limitation R quad mild limitation, L quad moderate limitation  POSTURE: RLE about 1/4 inch longer than L LE   PALPATION: Lumbar area tight but not tender, L glute tight but not tender, TFL not tender, lateral thigh and proximal quad very tight and tender   LOWER EXTREMITY ROM:  Passive ROM Right eval Left eval  Hip flexion WNL  WNL   Hip extension  Moderate limitation   Hip abduction  WNL   Hip adduction    Hip internal rotation  WNL   Hip external rotation  WNL   Knee flexion    Knee extension    Ankle dorsiflexion    Ankle plantarflexion    Ankle inversion    Ankle eversion     (Blank rows = not tested)  LOWER EXTREMITY MMT:  MMT Right eval Left eval  Hip flexion 3 3  Hip extension 3+ 3+  Hip abduction 4+ 4+  Hip adduction    Hip internal rotation    Hip external rotation    Knee flexion 4+ 4+  Knee extension 4+ 4+  Ankle dorsiflexion 5 5  Ankle plantarflexion  Ankle inversion    Ankle eversion     (Blank rows = not tested)  HIP SPECIAL TESTS Scour test negative FABER test negative   TODAY'S TREATMENT:                                                                                                                              DATE:                     08/29/22.                             [place] at 1.50 W/CM2 x 12 minutes f/b STW/M x 12 minutes to LT  SIJ, LB paras and glute  HMP and IFC at 80-150 Hz on 40% scan x 20 minutes. Normal modality response following removal of modality.  PATIENT EDUCATION:  Education details: Reviewed left hip stretch into extension to be used if she has a flare-up preceded by an adductor squeeze. Person educated:  Education method:  Education comprehension:   HOME EXERCISE PROGRAM: Demonstrated S and DKTC, mini-crunches, hip bridges and left hip  extension stretch (to do when flared-up)  ASSESSMENT:  CLINICAL IMPRESSION: Pt arrived today with mainly soreness LT side LB/SIJ. Rx focused on LT SIJ area with notable soreness during STW , but not pain. She was able to meet all LTGs and will be DC'd to [facility].       OBJECTIVE IMPAIRMENTS: Abnormal gait, difficulty walking, decreased ROM, decreased strength, increased edema, increased muscle spasms, impaired flexibility, and pain.   ACTIVITY LIMITATIONS: squatting and locomotion level  PARTICIPATION LIMITATIONS: community activity  PERSONAL FACTORS: Fitness, Past/current experiences, and Time since onset of injury/illness/exacerbation are also affecting patient's functional outcome.   REHAB POTENTIAL: Excellent  CLINICAL DECISION MAKING: Stable/uncomplicated  EVALUATION COMPLEXITY: Low  GOALS: Goals reviewed with patient? Yes  SHORT TERM GOALS: Target date: 08/18/2022   Will be compliant with appropriate progressive HEP  Baseline: Goal status: MET  2.  Frequency of numbness and edema L hip/thigh to have improved by 50%  Baseline:  Goal status: MET  3.  Pain in L hip to be no more than 1/10 at worst with challenging activities  Baseline:  Goal status: MET  4.  Quad and hip flexor flexibility limitations to have improved by 50%  Baseline:  Goal status: MET   LONG TERM GOALS: Target date: 09/08/2022  MMT to have improved by 1 grade in all weak groups  Baseline: on going Goal status: 2.  Will be able to hike at least 10 miles without increase in pain L hip  Baseline:  Goal status: MET  3.  Numbness and edema L hip and thigh to have completely resolved  Baseline:  Goal status: MET  4.  FOTO score to have improved to goal level by time of DC to show subjective functional improvement  Baseline:  Goal status: MET  PLAN:  PT FREQUENCY: 2x/week  PT DURATION: 6 weeks  PLANNED INTERVENTIONS: Therapeutic exercises, Therapeutic activity, Gait training,  Patient/Family education, Self Care, Joint mobilization, Aquatic Therapy, Dry Needling, Cryotherapy, Moist heat, Taping, Ionotophoresis 4mg /ml Dexamethasone, Manual therapy, and Re-evaluation  PLAN FOR NEXT SESSION: functional strengthening for weak groups, flexibility work for muscles in spasm- consider   [employer], PTA

## 2022-09-12 DIAGNOSIS — H6121 Impacted cerumen, right ear: Secondary | ICD-10-CM | POA: Diagnosis not present

## 2022-09-12 DIAGNOSIS — Z0001 Encounter for general adult medical examination with abnormal findings: Secondary | ICD-10-CM | POA: Diagnosis not present

## 2022-09-12 DIAGNOSIS — G479 Sleep disorder, unspecified: Secondary | ICD-10-CM | POA: Diagnosis not present

## 2022-09-12 DIAGNOSIS — R030 Elevated blood-pressure reading, without diagnosis of hypertension: Secondary | ICD-10-CM | POA: Diagnosis not present

## 2022-09-12 DIAGNOSIS — Z0000 Encounter for general adult medical examination without abnormal findings: Secondary | ICD-10-CM | POA: Diagnosis not present

## 2022-11-10 DIAGNOSIS — M25561 Pain in right knee: Secondary | ICD-10-CM | POA: Diagnosis not present

## 2022-11-21 DIAGNOSIS — E039 Hypothyroidism, unspecified: Secondary | ICD-10-CM | POA: Diagnosis not present

## 2022-11-21 DIAGNOSIS — N951 Menopausal and female climacteric states: Secondary | ICD-10-CM | POA: Diagnosis not present

## 2022-11-23 DIAGNOSIS — N951 Menopausal and female climacteric states: Secondary | ICD-10-CM | POA: Diagnosis not present

## 2022-11-23 DIAGNOSIS — Z6822 Body mass index (BMI) 22.0-22.9, adult: Secondary | ICD-10-CM | POA: Diagnosis not present

## 2022-11-23 DIAGNOSIS — R5383 Other fatigue: Secondary | ICD-10-CM | POA: Diagnosis not present

## 2022-11-23 DIAGNOSIS — M2550 Pain in unspecified joint: Secondary | ICD-10-CM | POA: Diagnosis not present

## 2022-11-23 DIAGNOSIS — G479 Sleep disorder, unspecified: Secondary | ICD-10-CM | POA: Diagnosis not present

## 2022-11-28 DIAGNOSIS — M25561 Pain in right knee: Secondary | ICD-10-CM | POA: Diagnosis not present

## 2022-11-28 DIAGNOSIS — S83209A Unspecified tear of unspecified meniscus, current injury, unspecified knee, initial encounter: Secondary | ICD-10-CM | POA: Diagnosis not present

## 2022-12-08 DIAGNOSIS — M170 Bilateral primary osteoarthritis of knee: Secondary | ICD-10-CM | POA: Diagnosis not present

## 2023-01-13 DIAGNOSIS — J028 Acute pharyngitis due to other specified organisms: Secondary | ICD-10-CM | POA: Diagnosis not present

## 2023-01-19 DIAGNOSIS — M1711 Unilateral primary osteoarthritis, right knee: Secondary | ICD-10-CM | POA: Diagnosis not present

## 2023-01-26 DIAGNOSIS — M1711 Unilateral primary osteoarthritis, right knee: Secondary | ICD-10-CM | POA: Diagnosis not present

## 2023-02-22 DIAGNOSIS — N951 Menopausal and female climacteric states: Secondary | ICD-10-CM | POA: Diagnosis not present

## 2023-02-22 DIAGNOSIS — R6882 Decreased libido: Secondary | ICD-10-CM | POA: Diagnosis not present

## 2023-02-22 DIAGNOSIS — Z79890 Hormone replacement therapy (postmenopausal): Secondary | ICD-10-CM | POA: Diagnosis not present

## 2023-02-22 DIAGNOSIS — R232 Flushing: Secondary | ICD-10-CM | POA: Diagnosis not present

## 2023-02-22 DIAGNOSIS — Z6823 Body mass index (BMI) 23.0-23.9, adult: Secondary | ICD-10-CM | POA: Diagnosis not present

## 2023-04-10 DIAGNOSIS — H52223 Regular astigmatism, bilateral: Secondary | ICD-10-CM | POA: Diagnosis not present

## 2023-04-10 DIAGNOSIS — D3132 Benign neoplasm of left choroid: Secondary | ICD-10-CM | POA: Diagnosis not present

## 2023-04-10 DIAGNOSIS — H5203 Hypermetropia, bilateral: Secondary | ICD-10-CM | POA: Diagnosis not present

## 2023-04-10 DIAGNOSIS — H43812 Vitreous degeneration, left eye: Secondary | ICD-10-CM | POA: Diagnosis not present

## 2023-04-10 DIAGNOSIS — H25813 Combined forms of age-related cataract, bilateral: Secondary | ICD-10-CM | POA: Diagnosis not present

## 2023-04-10 DIAGNOSIS — H353131 Nonexudative age-related macular degeneration, bilateral, early dry stage: Secondary | ICD-10-CM | POA: Diagnosis not present

## 2023-04-10 DIAGNOSIS — H524 Presbyopia: Secondary | ICD-10-CM | POA: Diagnosis not present

## 2023-05-23 NOTE — Progress Notes (Unsigned)
 [facility] [address] [place] [address] Phone: [phone] Subjective:   INadine Counts, am serving as a scribe for Dr. Antoine Primas.  I'm seeing this patient by the request  of:  Rick Duff, PA-C  CC: Right shoulder pain  BJY:[id number]  RAMANI RIVA is a 73 y.o. female coming in with complaint of R shoulder pain. Patient states has been having pain for 3 weeks. Pain comes and goes. Had to take 1/2 a tramadol. Has used tens unit and heat. Pain can radiated down the arm. No numbness or tingling.    Has also been seen recently for right knee pain from an outside facility.  Has been doing physical therapy.  Past Medical History:  Diagnosis Date   Arthritis    Trigger finger    Past Surgical History:  Procedure Laterality Date   TUBAL LIGATION     Social History   Socioeconomic History   Marital status: Single    Spouse name: Not on file   Number of children: Not on file   Years of education: Not on file   Highest education level: Not on file  Occupational History   Not on file  Tobacco Use   Smoking status: Former    Current packs/day: 0.00    Types: Cigarettes    Quit date: 03/22/1968    Years since quitting: 55.2   Smokeless tobacco: Never  Vaping Use   Vaping status: Never Used  Substance and Sexual Activity   Alcohol use: Yes    Comment: occasionally   Drug use: No   Sexual activity: Not on file  Other Topics Concern   Not on file  Social History Narrative   Not on file   Social Drivers of Health   Financial Resource Strain: Not on file  Food Insecurity: Not on file  Transportation Needs: Not on file  Physical Activity: Not on file  Stress: Not on file  Social Connections: Unknown (07/18/2021)   Received from [facility], [facility]   Social Network    Social Network: Not on file   Allergies  Allergen Reactions   Codeine Nausea Only   Family History  Problem Relation Age of Onset    Dementia Mother    Heart attack Father    Diabetes Father    Hyperlipidemia Father    Hypertension Father    Heart attack Sister    Cancer Brother      Current Outpatient Medications (Cardiovascular):    nitroGLYCERIN (NITRO-DUR) 0.2 mg/hr patch, Apply 1/4 of a patch to skin once daily.   Current Outpatient Medications (Analgesics):    traMADol (ULTRAM) 50 MG tablet, Take 1 tablet (50 mg total) by mouth every 8 (eight) hours as needed.   Current Outpatient Medications (Other):    pantoprazole (PROTONIX) 40 MG tablet, Take 1 tablet (40 mg total) by mouth daily.   RESTASIS MULTIDOSE 0.05 % ophthalmic emulsion,    valACYclovir (VALTREX) 1000 MG tablet, TAKE 1 TABLET BY MOUTH TWICE DAILY FOR  PROVENTION   Reviewed prior external information including notes and imaging from  primary care provider As well as notes that were available from care everywhere and other healthcare systems.  Past medical history, social, surgical and family history all reviewed in electronic medical record.  No pertanent information unless stated regarding to the chief complaint.   Review of Systems:  No headache, visual changes, nausea, vomiting, diarrhea, constipation, dizziness, abdominal pain, skin rash, fevers, chills,  night sweats, weight loss, swollen lymph nodes, body aches, joint swelling, chest pain, shortness of breath, mood changes. POSITIVE muscle aches  Objective  Blood pressure (!) 130/90, pulse 62, height 5\' 5"  (1.651 m), SpO2 96%.   General: No apparent distress alert and oriented x3 mood and affect normal, dressed appropriately.  HEENT: Pupils equal, extraocular movements intact  Respiratory: Patient's speak in full sentences and does not appear short of breath  Cardiovascular: No lower extremity edema, non tender, no erythema  Right shoulder exam shows patient does have some weakness noted of the rotator cuff.  Some tenderness to palpation in the paraspinal musculature.  Impingement  noted.  Weakness with 4 out of 5 strength noted.  Patient is left leg does have approximately 1/2 cm larger in diameter just proximal to the knee on the left side.  Patient is severely tender to palpation in the calf and the popliteal area on the left side.  Limited muscular skeletal ultrasound was performed and interpreted by Antoine Primas, M  Impingement noted view shows the patient does have what appears to be a supraspinatus tear noted.  Appears to be high-grade, there is some cortical irregularity noted of both the glenohumeral joint and the acromioclavicular joint that is consistent with potential difficulty. Impression: Rotator cuff tear noted with underlying arthritic changes.  [id number]; 15 additional minutes spent for Therapeutic exercises as stated in above notes.  This included exercises focusing on stretching, strengthening, with significant focus on eccentric aspects.   Long term goals include an improvement in range of motion, strength, endurance as well as avoiding reinjury. Patient's frequency would include in 1-2 times a day, 3-5 times a week for a duration of 6-12 weeks. Shoulder Exercises that included:  Basic scapular stabilization to include adduction and depression of scapula Scaption, focusing on proper movement and good control Internal and External rotation utilizing a theraband, with elbow tucked at side entire time Rows with theraband   Proper technique shown and discussed handout in great detail with ATC.  All questions were discussed and answered.      Impression and Recommendations:     The above documentation has been reviewed and is accurate and complete Judi Saa, DO

## 2023-05-24 ENCOUNTER — Encounter: Payer: Self-pay | Admitting: Family Medicine

## 2023-05-24 ENCOUNTER — Ambulatory Visit (INDEPENDENT_AMBULATORY_CARE_PROVIDER_SITE_OTHER)

## 2023-05-24 ENCOUNTER — Ambulatory Visit: Admitting: Family Medicine

## 2023-05-24 ENCOUNTER — Other Ambulatory Visit: Payer: Self-pay

## 2023-05-24 VITALS — BP 130/90 | HR 62 | Ht 65.0 in

## 2023-05-24 DIAGNOSIS — Z86718 Personal history of other venous thrombosis and embolism: Secondary | ICD-10-CM | POA: Diagnosis not present

## 2023-05-24 DIAGNOSIS — M25519 Pain in unspecified shoulder: Secondary | ICD-10-CM | POA: Diagnosis not present

## 2023-05-24 DIAGNOSIS — M7989 Other specified soft tissue disorders: Secondary | ICD-10-CM

## 2023-05-24 DIAGNOSIS — M25511 Pain in right shoulder: Secondary | ICD-10-CM

## 2023-05-24 DIAGNOSIS — M75111 Incomplete rotator cuff tear or rupture of right shoulder, not specified as traumatic: Secondary | ICD-10-CM | POA: Diagnosis not present

## 2023-05-24 DIAGNOSIS — I82432 Acute embolism and thrombosis of left popliteal vein: Secondary | ICD-10-CM

## 2023-05-24 DIAGNOSIS — M4802 Spinal stenosis, cervical region: Secondary | ICD-10-CM | POA: Diagnosis not present

## 2023-05-24 DIAGNOSIS — Z79890 Hormone replacement therapy (postmenopausal): Secondary | ICD-10-CM | POA: Diagnosis not present

## 2023-05-24 DIAGNOSIS — M2550 Pain in unspecified joint: Secondary | ICD-10-CM | POA: Diagnosis not present

## 2023-05-24 DIAGNOSIS — N951 Menopausal and female climacteric states: Secondary | ICD-10-CM | POA: Diagnosis not present

## 2023-05-24 DIAGNOSIS — M47812 Spondylosis without myelopathy or radiculopathy, cervical region: Secondary | ICD-10-CM | POA: Diagnosis not present

## 2023-05-24 DIAGNOSIS — M19011 Primary osteoarthritis, right shoulder: Secondary | ICD-10-CM | POA: Diagnosis not present

## 2023-05-24 DIAGNOSIS — M50322 Other cervical disc degeneration at C5-C6 level: Secondary | ICD-10-CM | POA: Diagnosis not present

## 2023-05-24 DIAGNOSIS — R232 Flushing: Secondary | ICD-10-CM | POA: Diagnosis not present

## 2023-05-24 DIAGNOSIS — R6882 Decreased libido: Secondary | ICD-10-CM | POA: Diagnosis not present

## 2023-05-24 DIAGNOSIS — M75101 Unspecified rotator cuff tear or rupture of right shoulder, not specified as traumatic: Secondary | ICD-10-CM | POA: Insufficient documentation

## 2023-05-24 DIAGNOSIS — Z6823 Body mass index (BMI) 23.0-23.9, adult: Secondary | ICD-10-CM | POA: Diagnosis not present

## 2023-05-24 DIAGNOSIS — M542 Cervicalgia: Secondary | ICD-10-CM | POA: Diagnosis not present

## 2023-05-24 LAB — C-REACTIVE PROTEIN: CRP: <1 mg/dL (ref 0.5–20.0)

## 2023-05-24 LAB — CBC WITH DIFFERENTIAL/PLATELET
Basophils Absolute: 0 10*3/uL (ref 0.0–0.1)
Basophils Relative: 0.7 % (ref 0.0–3.0)
Eosinophils Absolute: 0.1 10*3/uL (ref 0.0–0.7)
Eosinophils Relative: 2.6 % (ref 0.0–5.0)
HCT: 46.5 % — ABNORMAL HIGH (ref 36.0–46.0)
Hemoglobin: 15.7 g/dL — ABNORMAL HIGH (ref 12.0–15.0)
Lymphocytes Relative: 28.2 % (ref 12.0–46.0)
Lymphs Abs: 1.5 10*3/uL (ref 0.7–4.0)
MCHC: 33.7 g/dL (ref 30.0–36.0)
MCV: 91 fl (ref 78.0–100.0)
Monocytes Absolute: 0.4 10*3/uL (ref 0.1–1.0)
Monocytes Relative: 8.6 % (ref 3.0–12.0)
Neutro Abs: 3.1 10*3/uL (ref 1.4–7.7)
Neutrophils Relative %: 59.9 % (ref 43.0–77.0)
Platelets: 282 10*3/uL (ref 150.0–400.0)
RBC: 5.11 Mil/uL (ref 3.87–5.11)
RDW: 12.5 % (ref 11.5–15.5)
WBC: 5.2 10*3/uL (ref 4.0–10.5)

## 2023-05-24 LAB — COMPREHENSIVE METABOLIC PANEL
ALT: 30 U/L (ref 0–35)
AST: 31 U/L (ref 0–37)
Albumin: 4.6 g/dL (ref 3.5–5.2)
Alkaline Phosphatase: 67 U/L (ref 39–117)
BUN: 13 mg/dL (ref 6–23)
CO2: 31 meq/L (ref 19–32)
Calcium: 9.6 mg/dL (ref 8.4–10.5)
Chloride: 102 meq/L (ref 96–112)
Creatinine, Ser: 0.59 mg/dL (ref 0.40–1.20)
GFR: 90.83 mL/min (ref >60.00)
Glucose, Bld: 86 mg/dL (ref 70–99)
Potassium: 4.1 meq/L (ref 3.5–5.1)
Sodium: 140 meq/L (ref 135–145)
Total Bilirubin: 0.7 mg/dL (ref 0.2–1.2)
Total Protein: 7.2 g/dL (ref 6.0–8.3)

## 2023-05-24 LAB — VITAMIN B12: Vitamin B-12: 579 pg/mL (ref 211–911)

## 2023-05-24 LAB — TSH: TSH: 1.58 u[IU]/mL (ref 0.35–5.50)

## 2023-05-24 LAB — URIC ACID: Uric Acid, Serum: 5.3 mg/dL (ref 2.4–7.0)

## 2023-05-24 LAB — IBC PANEL
Iron: 120 ug/dL (ref 42–145)
Saturation Ratios: 28.8 % (ref 20.0–50.0)
TIBC: 417.2 ug/dL (ref 250.0–450.0)
Transferrin: 298 mg/dL (ref 212.0–360.0)

## 2023-05-24 LAB — FERRITIN: Ferritin: 59.3 ng/mL (ref 10.0–291.0)

## 2023-05-24 LAB — VITAMIN D 25 HYDROXY (VIT D DEFICIENCY, FRACTURES): VITD: 25.9 ng/mL — ABNORMAL LOW (ref 30.00–100.00)

## 2023-05-24 LAB — SEDIMENTATION RATE: Sed Rate: 20 mm/h (ref 0–30)

## 2023-05-24 LAB — TESTOSTERONE: Testosterone: 156.77 ng/dL — ABNORMAL HIGH (ref 15.00–40.00)

## 2023-05-24 MED ORDER — NITROGLYCERIN 0.2 MG/HR TD PT24: MEDICATED_PATCH | TRANSDERMAL | 0 refills | Status: DC

## 2023-05-24 NOTE — Assessment & Plan Note (Signed)
 Patient has a rotator cuff tear that appears to be acute on chronic and likely high-grade noted.  Discussed icing regimen and home exercises, discussed which activities to do and which ones to avoid.  Work with Event organiser.  Nitroglycerin patches given and warned of potential side effects.  Patient will follow-up with me again in 6 to 8 weeks to further evaluate.  Worsening pain I do think advanced imaging would be warranted.  X-rays are pending.

## 2023-05-24 NOTE — Patient Instructions (Addendum)
 [facility]  ([facility] [employer] in [facility]) [address] [place], [place] [address] [phone]  Xrays today Labs today Do prescribed exercises at least 3x a week Ice after activity See you again in 6-8 weeks  Nitroglycerin Protocol   Apply 1/4 nitroglycerin patch to affected area daily.  Change position of patch within the affected area every 24 hours.  You may experience a headache during the first 1-2 weeks of using the patch, these should subside.  If you experience headaches after beginning nitroglycerin patch treatment, you may take your preferred over the counter pain reliever.  Another side effect of the nitroglycerin patch is skin irritation or rash related to patch adhesive.  Please notify our office if you develop more severe headaches or rash, and stop the patch.  Tendon healing with nitroglycerin patch may require 12 to 24 weeks depending on the extent of injury.  Men should not use if taking Viagra, Cialis, or Levitra.   Do not use if you have migraines or rosacea.

## 2023-05-24 NOTE — Assessment & Plan Note (Addendum)
 Enlargement of the left leg noted.  That was a postsurgical DVT.  Discussed with patient at this point do feel to take a high dose aspirin.  Labs ordered today to further evaluate with patient unable to be a patient today to get the Doppler done.  Patient knows that if any shortness of breath, chest pain to seek medical attention immediately.  Patient is in agreement with the plan.  Follow-up with me again 6 to 8 weeks.  Ordered an ABI to further evaluate as well for any other vascular compromise that could be contributing to the abnormality in the leg diameter

## 2023-05-25 ENCOUNTER — Ambulatory Visit (HOSPITAL_COMMUNITY)
Admission: RE | Admit: 2023-05-25 | Discharge: 2023-05-25 | Disposition: A | Discharge status: Home / Self Care | Source: Ambulatory Visit | Attending: Family Medicine | Admitting: Family Medicine

## 2023-05-25 ENCOUNTER — Encounter: Payer: Self-pay | Admitting: Family Medicine

## 2023-05-25 DIAGNOSIS — M7989 Other specified soft tissue disorders: Secondary | ICD-10-CM | POA: Diagnosis not present

## 2023-05-25 DIAGNOSIS — I82432 Acute embolism and thrombosis of left popliteal vein: Secondary | ICD-10-CM | POA: Diagnosis not present

## 2023-05-26 LAB — PTH, INTACT AND CALCIUM
Calcium: 9.8 mg/dL (ref 8.6–10.4)
PTH: 30 pg/mL (ref 16–77)

## 2023-05-26 LAB — RHEUMATOID FACTOR: Rheumatoid fact SerPl-aCnc: <10 [IU]/mL (ref <14)

## 2023-05-26 LAB — CALCIUM, IONIZED: Calcium, Ion: 5.1 mg/dL (ref 4.7–5.5)

## 2023-05-26 LAB — D-DIMER, QUANTITATIVE: D-Dimer, Quant: 0.33 ug{FEU}/mL (ref <0.50)

## 2023-05-26 LAB — ANA: Anti Nuclear Antibody (ANA): NEGATIVE

## 2023-05-26 LAB — CYCLIC CITRUL PEPTIDE ANTIBODY, IGG: Cyclic Citrullin Peptide Ab: <16 U

## 2023-05-26 LAB — ANGIOTENSIN CONVERTING ENZYME: Angiotensin-Converting Enzyme: 33 U/L (ref 9–67)

## 2023-05-29 ENCOUNTER — Other Ambulatory Visit: Payer: Self-pay | Admitting: Physician Assistant

## 2023-05-29 DIAGNOSIS — Z1231 Encounter for screening mammogram for malignant neoplasm of breast: Secondary | ICD-10-CM

## 2023-05-31 ENCOUNTER — Encounter: Payer: Self-pay | Admitting: Family Medicine

## 2023-06-09 ENCOUNTER — Ambulatory Visit (HOSPITAL_COMMUNITY)
Admission: RE | Admit: 2023-06-09 | Discharge: 2023-06-09 | Disposition: A | Discharge status: Home / Self Care | Source: Ambulatory Visit | Attending: Family Medicine | Admitting: Family Medicine

## 2023-06-09 ENCOUNTER — Encounter: Payer: Self-pay | Admitting: Family Medicine

## 2023-06-09 DIAGNOSIS — M7989 Other specified soft tissue disorders: Secondary | ICD-10-CM | POA: Diagnosis not present

## 2023-06-09 DIAGNOSIS — R600 Localized edema: Secondary | ICD-10-CM | POA: Diagnosis not present

## 2023-06-09 LAB — VAS US LOWER EXT ART SEG MULTI (SEGMENTALS & LE RAYNAUDS)
Left ABI: 1.23
Right ABI: 1.29

## 2023-07-03 ENCOUNTER — Ambulatory Visit
Admission: RE | Admit: 2023-07-03 | Discharge: 2023-07-03 | Disposition: A | Discharge status: Home / Self Care | Source: Ambulatory Visit | Attending: Physician Assistant | Admitting: Physician Assistant

## 2023-07-03 DIAGNOSIS — Z1231 Encounter for screening mammogram for malignant neoplasm of breast: Secondary | ICD-10-CM | POA: Diagnosis not present

## 2023-07-03 DIAGNOSIS — R7309 Other abnormal glucose: Secondary | ICD-10-CM | POA: Diagnosis not present

## 2023-07-03 DIAGNOSIS — D582 Other hemoglobinopathies: Secondary | ICD-10-CM | POA: Diagnosis not present

## 2023-07-06 NOTE — Progress Notes (Signed)
 [facility] [address] [place] [address] Phone: [phone] Subjective:   Carol Hoover, am serving as a scribe for Dr. Ronnell Coins.  I'm seeing this patient by the request  of:  Ira Mann  CC: Right shoulder and left leg pain follow-up  BJY:NWGNFAOZHY  05/24/2023 Enlargement of the left leg noted.  That was a postsurgical DVT.  Discussed with patient at this point do feel to take a high dose aspirin.  Labs ordered today to further evaluate with patient unable to be a patient today to get the Doppler done.  Patient knows that if any shortness of breath, chest pain to seek medical attention immediately.  Patient is in agreement with the plan.  Follow-up with me again 6 to 8 weeks.  Ordered an ABI to further evaluate as well for any other vascular compromise that could be contributing to the abnormality in the leg diameter     Patient has a rotator cuff tear that appears to be acute on chronic and likely high-grade noted.  Discussed icing regimen and home exercises, discussed which activities to do and which ones to avoid.  Work with Event organiser.  Nitroglycerin  patches given and warned of potential side effects.  Patient will follow-up with me again in 6 to 8 weeks to further evaluate.  Worsening pain I do think advanced imaging would be warranted.  X-rays are pending.      Update 07/07/2023 Carol Hoover is a 73 y.o. female coming in with complaint of R shoulder and L leg pain. Patient states that her shoulder pain is improving. For some reason she had an increase in pain last night. Able to sleep better. Pain is less when her arm is lower when grooming.   Still has swelling of L leg. Pain in groin. Painful when she performs sit to stand.       Past Medical History:  Diagnosis Date   Arthritis    Trigger finger    Past Surgical History:  Procedure Laterality Date   TUBAL LIGATION     Social History   Socioeconomic  History   Marital status: Single    Spouse name: Not on file   Number of children: Not on file   Years of education: Not on file   Highest education level: Not on file  Occupational History   Not on file  Tobacco Use   Smoking status: Former    Current packs/day: 0.00    Types: Cigarettes    Quit date: 03/22/1968    Years since quitting: 55.3   Smokeless tobacco: Never  Vaping Use   Vaping status: Never Used  Substance and Sexual Activity   Alcohol use: Yes    Comment: occasionally   Drug use: No   Sexual activity: Not on file  Other Topics Concern   Not on file  Social History Narrative   Not on file   Social Drivers of Health   Financial Resource Strain: Not on file  Food Insecurity: Not on file  Transportation Needs: Not on file  Physical Activity: Not on file  Stress: Not on file  Social Connections: Unknown (07/18/2021)   Received from [facility]   Social Network    Social Network: Not on file   Allergies  Allergen Reactions   Codeine Nausea Only   Family History  Problem Relation Age of Onset   Dementia Mother    Heart attack Father  Diabetes Father    Hyperlipidemia Father    Hypertension Father    Heart attack Sister    Cancer Brother      Current Outpatient Medications (Cardiovascular):    nitroGLYCERIN  (NITRO-DUR ) 0.2 mg/hr patch, Apply 1/4 of a patch to skin once daily.  Current Outpatient Medications (Respiratory):    montelukast (SINGULAIR) 10 MG tablet, Take 1 tablet (10 mg total) by mouth at bedtime.  Current Outpatient Medications (Analgesics):    traMADol  (ULTRAM ) 50 MG tablet, Take 1 tablet (50 mg total) by mouth every 8 (eight) hours as needed.   Current Outpatient Medications (Other):    pantoprazole  (PROTONIX ) 40 MG tablet, Take 1 tablet (40 mg total) by mouth daily.   RESTASIS MULTIDOSE 0.05 % ophthalmic emulsion,    valACYclovir  (VALTREX ) 1000 MG tablet, TAKE 1 TABLET BY MOUTH TWICE DAILY FOR   PROVENTION   Reviewed prior external information including notes and imaging from  primary care provider As well as notes that were available from care everywhere and other healthcare systems.  Past medical history, social, surgical and family history all reviewed in electronic medical record.  No pertanent information unless stated regarding to the chief complaint.   Review of Systems:  No headache, visual changes, nausea, vomiting, diarrhea, constipation, dizziness, abdominal pain, skin rash, fevers, chills, night sweats, weight loss, swollen lymph nodes, body aches, joint swelling, chest pain, shortness of breath, mood changes. POSITIVE muscle aches  Objective  Blood pressure 124/72, pulse (!) 58, height 5\' 5"  (1.651 m), weight 130 lb (59 kg), SpO2 97%.   General: No apparent distress alert and oriented x3 mood and affect normal, dressed appropriately.  HEENT: Pupils equal, extraocular movements intact  Respiratory: Patient's speak in full sentences and does not appear short of breath  Cardiovascular: No lower extremity edema, non tender, no erythema  Antalgic gait noted.  Still has some swelling of the left lower extremity. Right shoulder still has some positive impingement noted.  4 out of 5 strength of the rotator cuff which is different than the contralateral side.  Still has some retraction noted of the rotator cuff.  Still has some high-grade aspect.  Went down mL from 1.6 to 1.2 cm in retraction.    Impression and Recommendations:     The above documentation has been reviewed and is accurate and complete Kortney Schoenfelder M Kischa Altice, DO

## 2023-07-07 ENCOUNTER — Ambulatory Visit: Admitting: Family Medicine

## 2023-07-07 ENCOUNTER — Other Ambulatory Visit: Payer: Self-pay

## 2023-07-07 ENCOUNTER — Ambulatory Visit (INDEPENDENT_AMBULATORY_CARE_PROVIDER_SITE_OTHER)

## 2023-07-07 VITALS — BP 124/72 | HR 58 | Ht 65.0 in | Wt 130.0 lb

## 2023-07-07 DIAGNOSIS — M25562 Pain in left knee: Secondary | ICD-10-CM

## 2023-07-07 DIAGNOSIS — M25552 Pain in left hip: Secondary | ICD-10-CM

## 2023-07-07 DIAGNOSIS — I878 Other specified disorders of veins: Secondary | ICD-10-CM | POA: Diagnosis not present

## 2023-07-07 DIAGNOSIS — M7989 Other specified soft tissue disorders: Secondary | ICD-10-CM | POA: Diagnosis not present

## 2023-07-07 DIAGNOSIS — M75111 Incomplete rotator cuff tear or rupture of right shoulder, not specified as traumatic: Secondary | ICD-10-CM

## 2023-07-07 DIAGNOSIS — Z471 Aftercare following joint replacement surgery: Secondary | ICD-10-CM | POA: Diagnosis not present

## 2023-07-07 DIAGNOSIS — M25511 Pain in right shoulder: Secondary | ICD-10-CM

## 2023-07-07 DIAGNOSIS — Z96652 Presence of left artificial knee joint: Secondary | ICD-10-CM | POA: Diagnosis not present

## 2023-07-07 DIAGNOSIS — M79605 Pain in left leg: Secondary | ICD-10-CM | POA: Diagnosis not present

## 2023-07-07 DIAGNOSIS — M160 Bilateral primary osteoarthritis of hip: Secondary | ICD-10-CM | POA: Diagnosis not present

## 2023-07-07 DIAGNOSIS — M25462 Effusion, left knee: Secondary | ICD-10-CM | POA: Diagnosis not present

## 2023-07-07 MED ORDER — NITROGLYCERIN 0.2 MG/HR TD PT24: MEDICATED_PATCH | TRANSDERMAL | 0 refills | Status: DC

## 2023-07-07 MED ORDER — MONTELUKAST SODIUM 10 MG PO TABS: 10.0000 mg | ORAL_TABLET | Freq: Every day | ORAL | 3 refills | Status: DC

## 2023-07-07 NOTE — Patient Instructions (Addendum)
 L hip and L knee xray today Refill NitrogenPatches Upright go necklace Do prescribed exercises at least 3x a week  See you again in 6-8 weeks

## 2023-07-07 NOTE — Assessment & Plan Note (Addendum)
 Some interval improvement noted.  Discussed with patient at great length and will do more of the nitroglycerin  still.  Does seem to be healing and very slowly but hopeful that this will continue to make improvement.  Discussed with patient about icing regimen and home exercises.  Follow-up with me again in 2 to 3 months.  At that point we will need to discuss the possibility of advanced imaging or the possibility of PRP.  Refilled nitroglycerin  patches to do a quarter of the patch daily on the area.

## 2023-07-09 ENCOUNTER — Encounter: Payer: Self-pay | Admitting: Family Medicine

## 2023-08-01 DIAGNOSIS — S0502XA Injury of conjunctiva and corneal abrasion without foreign body, left eye, initial encounter: Secondary | ICD-10-CM | POA: Diagnosis not present

## 2023-08-02 DIAGNOSIS — L820 Inflamed seborrheic keratosis: Secondary | ICD-10-CM | POA: Diagnosis not present

## 2023-08-02 DIAGNOSIS — L821 Other seborrheic keratosis: Secondary | ICD-10-CM | POA: Diagnosis not present

## 2023-08-02 DIAGNOSIS — L814 Other melanin hyperpigmentation: Secondary | ICD-10-CM | POA: Diagnosis not present

## 2023-08-02 DIAGNOSIS — L578 Other skin changes due to chronic exposure to nonionizing radiation: Secondary | ICD-10-CM | POA: Diagnosis not present

## 2023-08-02 DIAGNOSIS — L570 Actinic keratosis: Secondary | ICD-10-CM | POA: Diagnosis not present

## 2023-08-02 DIAGNOSIS — D751 Secondary polycythemia: Secondary | ICD-10-CM | POA: Diagnosis not present

## 2023-08-02 DIAGNOSIS — D1801 Hemangioma of skin and subcutaneous tissue: Secondary | ICD-10-CM | POA: Diagnosis not present

## 2023-08-11 DIAGNOSIS — R300 Dysuria: Secondary | ICD-10-CM | POA: Diagnosis not present

## 2023-08-16 DIAGNOSIS — Z6823 Body mass index (BMI) 23.0-23.9, adult: Secondary | ICD-10-CM | POA: Diagnosis not present

## 2023-08-16 DIAGNOSIS — N951 Menopausal and female climacteric states: Secondary | ICD-10-CM | POA: Diagnosis not present

## 2023-08-16 DIAGNOSIS — R232 Flushing: Secondary | ICD-10-CM | POA: Diagnosis not present

## 2023-08-16 DIAGNOSIS — E039 Hypothyroidism, unspecified: Secondary | ICD-10-CM | POA: Diagnosis not present

## 2023-08-18 ENCOUNTER — Ambulatory Visit: Admitting: Family Medicine

## 2023-09-06 NOTE — Progress Notes (Unsigned)
 [facility] [address] [place] [address] Phone: [phone] Subjective:   Carol Hoover, am serving as a scribe for Dr. Arthea Claudene.  I'm seeing this patient by the request  of:  Nena Rosina CROME, PA-C  CC: left hip pain knee and right shoulder  YEP:Dlagzrupcz  07/07/2023 Some interval improvement noted.  Discussed with patient at great length and will do more of the nitroglycerin  still.  Does seem to be healing and very slowly but hopeful that this will continue to make improvement.  Discussed with patient about icing regimen and home exercises.  Follow-up with me again in 2 to 3 months.  At that point we will need to discuss the possibility of advanced imaging or the possibility of PRP.  Refilled nitroglycerin  patches to do a quarter of the patch daily on the area.     Update 09/18/2023 Carol Hoover is a 73 y.o. female coming in with complaint of R shoulder, L hip and L knee. Patient states thinks her leg pain is coming from disc. Shoulder is doing better, gets a little ache, but much better than before.  07/07/2023 Xray L knee IMPRESSION: Status post total left knee arthroplasty without evidence of hardware failure.  07/07/2023 Xray L hip IMPRESSION: 1. Very mild bilateral femoroacetabular osteoarthritis. 2. 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     Past Medical History:  Diagnosis Date   Arthritis    Trigger finger    Past Surgical History:  Procedure Laterality Date   TUBAL LIGATION     Social History   Socioeconomic History   Marital status: Single    Spouse name: Not on file   Number of children: Not on file   Years of education: Not on file   Highest education level: Not on file  Occupational History   Not on file  Tobacco Use   Smoking status: Former    Current packs/day: 0.00    Types: Cigarettes    Quit date: 03/22/1968    Years since quitting: 55.5   Smokeless tobacco: Never  Vaping Use   Vaping  status: Never Used  Substance and Sexual Activity   Alcohol use: Yes    Comment: occasionally   Drug use: No   Sexual activity: Not on file  Other Topics Concern   Not on file  Social History Narrative   Not on file   Social Drivers of Health   Financial Resource Strain: Not on file  Food Insecurity: Not on file  Transportation Needs: Not on file  Physical Activity: Not on file  Stress: Not on file  Social Connections: Unknown (07/18/2021)   Received from [facility]   Social Network    Social Network: Not on file   Allergies  Allergen Reactions   Codeine Nausea Only   Family History  Problem Relation Age of Onset   Dementia Mother    Heart attack Father    Diabetes Father    Hyperlipidemia Father    Hypertension Father    Heart attack Sister    Cancer Brother      Current Outpatient Medications (Cardiovascular):    nitroGLYCERIN  (NITRO-DUR ) 0.2 mg/hr patch, Apply 1/4 of a patch to skin once daily.  Current Outpatient Medications (Respiratory):    montelukast  (SINGULAIR ) 10 MG tablet, Take 1 tablet (10 mg total) by mouth at bedtime.  Current Outpatient Medications (Analgesics):    traMADol  (ULTRAM ) 50 MG tablet, Take 1 tablet (50 mg total) by mouth  every 8 (eight) hours as needed.   Current Outpatient Medications (Other):    pantoprazole  (PROTONIX ) 40 MG tablet, Take 1 tablet (40 mg total) by mouth daily.   RESTASIS MULTIDOSE 0.05 % ophthalmic emulsion,    valACYclovir  (VALTREX ) 1000 MG tablet, TAKE 1 TABLET BY MOUTH TWICE DAILY FOR  PROVENTION   Reviewed prior external information including notes and imaging from  primary care provider As well as notes that were available from care everywhere and other healthcare systems.  Past medical history, social, surgical and family history all reviewed in electronic medical record.  No pertanent information unless stated regarding to the chief complaint.   Review of Systems:  No headache, visual changes, nausea,  vomiting, diarrhea, constipation, dizziness, abdominal pain, skin rash, fevers, chills, night sweats, weight loss, swollen lymph nodes, body aches, joint swelling, chest pain, shortness of breath, mood changes. POSITIVE muscle aches  Objective  Blood pressure 118/78, pulse (!) 59, height 5' 5 (1.651 m), weight 134 lb (60.8 kg), SpO2 98%.   General: No apparent distress alert and oriented x3 mood and affect normal, dressed appropriately.  HEENT: Pupils equal, extraocular movements intact  Respiratory: Patient's speak in full sentences and does not appear short of breath  Cardiovascular: No lower extremity edema, non tender, no erythema  Left hip exam shows tenderness more over the gluteal area on the left side.  Patient does have good core strength noted.  Some weakness of the hip abductor on the left compared to the right.  Limited muscular skeletal ultrasound was performed and interpreted by CLAUDENE HUSSAR, M  Limited ultrasound of patient's gluteal area on the left side has an overlying lipoma noted.  This is actually more superior than the sacroiliac joint.  Does not seem to communicate with any of the underlying musculature.     Impression and Recommendations:    The above documentation has been reviewed and is accurate and complete [name] M [name], DO

## 2023-09-18 ENCOUNTER — Ambulatory Visit: Admitting: Family Medicine

## 2023-09-18 ENCOUNTER — Other Ambulatory Visit: Payer: Self-pay

## 2023-09-18 ENCOUNTER — Encounter: Payer: Self-pay | Admitting: Family Medicine

## 2023-09-18 VITALS — BP 118/78 | HR 59 | Ht 65.0 in | Wt 134.0 lb

## 2023-09-18 DIAGNOSIS — M7602 Gluteal tendinitis, left hip: Secondary | ICD-10-CM | POA: Insufficient documentation

## 2023-09-18 DIAGNOSIS — M75111 Incomplete rotator cuff tear or rupture of right shoulder, not specified as traumatic: Secondary | ICD-10-CM | POA: Diagnosis not present

## 2023-09-18 DIAGNOSIS — M25569 Pain in unspecified knee: Secondary | ICD-10-CM

## 2023-09-18 DIAGNOSIS — M545 Low back pain, unspecified: Secondary | ICD-10-CM | POA: Diagnosis not present

## 2023-09-18 DIAGNOSIS — M7918 Myalgia, other site: Secondary | ICD-10-CM

## 2023-09-18 NOTE — Assessment & Plan Note (Signed)
 Patient on ultrasound has an overlying lipoma in the area where patient has discomfort.  Likely secondary to more of some muscle atrophy and chronic irritation.  Over the differential includes more of a lumbar radiculopathy.  Would need to consider the possibility of advanced imaging with an MRI of the pelvis without contrast as well as potentially MRI of the lumbar spine.  Discussed with patient to start formal physical therapy which patient has done in the past with great success.  Will follow-up with me again in 2 to 3 months otherwise.

## 2023-09-18 NOTE — Assessment & Plan Note (Signed)
 Still having some difficulty with the right shoulder.  Will continue to monitor.  No significant weakness at the moment.  Follow-up again in 6 to 8 weeks labs.

## 2023-09-18 NOTE — Patient Instructions (Addendum)
 Good to see you. PT in [place] Do prescribed exercises at least 3x a week  Voltaren topical instead of oral  See me again in 2 months

## 2023-09-22 DIAGNOSIS — Z1329 Encounter for screening for other suspected endocrine disorder: Secondary | ICD-10-CM | POA: Diagnosis not present

## 2023-09-22 DIAGNOSIS — Z13220 Encounter for screening for lipoid disorders: Secondary | ICD-10-CM | POA: Diagnosis not present

## 2023-09-22 DIAGNOSIS — Z0000 Encounter for general adult medical examination without abnormal findings: Secondary | ICD-10-CM | POA: Diagnosis not present

## 2023-09-29 ENCOUNTER — Ambulatory Visit: Attending: Family Medicine

## 2023-09-29 DIAGNOSIS — M7918 Myalgia, other site: Secondary | ICD-10-CM | POA: Diagnosis not present

## 2023-09-29 DIAGNOSIS — M5416 Radiculopathy, lumbar region: Secondary | ICD-10-CM | POA: Insufficient documentation

## 2023-09-29 DIAGNOSIS — M25552 Pain in left hip: Secondary | ICD-10-CM | POA: Diagnosis not present

## 2023-09-29 DIAGNOSIS — M545 Low back pain, unspecified: Secondary | ICD-10-CM | POA: Diagnosis not present

## 2023-09-29 NOTE — Therapy (Signed)
 OUTPATIENT PHYSICAL THERAPY THORACOLUMBAR EVALUATION   Patient Name: Carol Hoover MRN: [id number] DOB:[birth year], 73 y.o., female Today's Date: 09/29/2023  END OF SESSION:  PT End of Session - 09/29/23 0847     Visit Number 1    Number of Visits 8    Date for PT Re-Evaluation 11/03/23    PT Start Time 0848    PT Stop Time 0928    PT Time Calculation (min) 40 min    Activity Tolerance Patient tolerated treatment well    Behavior During Therapy [facility] for tasks assessed/performed          Past Medical History:  Diagnosis Date   Arthritis    Trigger finger    Past Surgical History:  Procedure Laterality Date   TUBAL LIGATION     Patient Active Problem List   Diagnosis Date Noted   Gluteal tendinitis of left buttock 09/18/2023   Right rotator cuff tear 05/24/2023   Osteoarthritis of knee 12/19/2018   History of deep vein thrombosis 12/19/2018   Acute deep vein thrombosis (DVT) of popliteal vein of left lower extremity (HCC) 04/25/2018   Acute deep vein thrombosis (DVT) of left peroneal vein (HCC) 04/25/2018   Well female exam with routine gynecological exam 03/22/2016   REFERRING PROVIDER: Claudene Arthea HERO, DO   REFERRING DIAG: Gluteal pain, Low back pain, unspecified back pain laterality, unspecified chronicity, unspecified whether sciatica present   Rationale for Evaluation and Treatment: Rehabilitation  THERAPY DIAG:  Radiculopathy, lumbar region  ONSET DATE: over 1 year  SUBJECTIVE:                                                                                                                                                                                           SUBJECTIVE STATEMENT: Patient reports that her back and hips have been bothering her for over a year. She has tried physical therapy and going to a chiropractor, but none of these have seemed to help. The chiropractor also seems to aggravate it for a few days afterward. She had tried dry needling  before which seemed to help. She also notes that her leg will swell and go numb, and this seems to happen about once per day. She will take Tramadol  as needed to help with her symptoms.   PERTINENT HISTORY:  Osteoarthritis   PAIN:  Are you having pain? Yes: NPRS scale: Current: 4-5/10 Best: 1-2/10 Worst: 7-8/10 Pain location: left low back and hip Pain description: numbness and aching Aggravating factors: lifting, squatting, working Relieving factors: medication and dry needling  PRECAUTIONS: None  RED FLAGS: None   WEIGHT BEARING RESTRICTIONS: No  FALLS:  Has patient fallen in last 6 months? No  LIVING ENVIRONMENT: Lives with: lives with an adult companion Lives in: House/apartment Has following equipment at home: None  OCCUPATION: pet groomer  PLOF: Independent  PATIENT GOALS: be able to hike and reduce her symptoms  NEXT MD VISIT: 11/24/23  OBJECTIVE:  Note: Objective measures were completed at Evaluation unless otherwise noted.  PATIENT SURVEYS:  Modified Oswestry:  MODIFIED OSWESTRY DISABILITY SCALE  Date: 09/29/23 Score  Pain intensity 0 = I can tolerate the pain I have without having to use pain medication.  2. Personal care (washing, dressing, etc.) 0 =  I can take care of myself normally without causing increased pain.  3. Lifting 1 = I can lift heavy weights, but it causes increased pain.  4. Walking 0 = Pain does not prevent me from walking any distance  5. Sitting 2 =  Pain prevents me from sitting more than 1 hour.  6. Standing 0 =  I can stand as long as I want without increased pain.  7. Sleeping 0 = Pain does not prevent me from sleeping well.  8. Social Life 0 = My social life is normal and does not increase my pain.  9. Traveling 1 =  I can travel anywhere, but it increases my pain.  10. Employment/ Homemaking 1 = My normal homemaking/job activities increase my pain, but I can still perform all that is required of me  Total 5/50   Interpretation  of scores: Score Category Description  0-20% Minimal Disability The patient can cope with most living activities. Usually no treatment is indicated apart from advice on lifting, sitting and exercise  21-40% Moderate Disability The patient experiences more pain and difficulty with sitting, lifting and standing. Travel and social life are more difficult and they may be disabled from work. Personal care, sexual activity and sleeping are not grossly affected, and the patient can usually be managed by conservative means  41-60% Severe Disability Pain remains the main problem in this group, but activities of daily living are affected. These patients require a detailed investigation  61-80% Crippled Back pain impinges on all aspects of the patient's life. Positive intervention is required  81-100% Bed-bound  These patients are either bed-bound or exaggerating their symptoms  Bluford FORBES Zoe DELENA Karon DELENA, et al. Surgery versus conservative management of stable thoracolumbar fracture: the PRESTO feasibility RCT. [place]): [employer]; 2021 Nov. [facility] Technology Assessment, No. 25.62.) Appendix 3, Oswestry Disability Index category descriptors. Available from: FindJewel[employer]cz  Minimally Clinically Important Difference (MCID) = 12.8%  COGNITION: Overall cognitive status: Within functional limits for tasks assessed     SENSATION: Light touch: Impaired with diminished sensation on left L2,4 dermatomes Patient reports that she has some left lateral thigh numbness.   POSTURE: forward head  PALPATION: TTP: left thoracic and lumbar paraspinals, gluteals, and piriformis  LUMBAR JOINT MOBILITY:  L  LUMBAR ROM:   AROM eval  Flexion 66  Extension 12; tightness in low back  Right lateral flexion WFL   Left lateral flexion WFL   Right rotation 25% limited  Left rotation WFL   (Blank rows = not tested)  LOWER EXTREMITY ROM: WFL for activities  assessed  LOWER EXTREMITY MMT:    MMT Right eval Left eval  Hip flexion 4/5 4-/5  Hip extension    Hip abduction    Hip adduction    Hip internal rotation    Hip external rotation    Knee  flexion 5/5 4/5  Knee extension 4+/5 4/5  Ankle dorsiflexion 4+/5 4+/5  Ankle plantarflexion    Ankle inversion    Ankle eversion     (Blank rows = not tested)  GAIT: Assistive device utilized: None Level of assistance: Complete Independence Comments: no significant gait deviations observed  TREATMENT DATE:                                                                                                                                  PATIENT EDUCATION:  Education details: POC, prognosis, and objective findings Person educated: Patient Education method: Explanation Education comprehension: verbalized understanding  HOME EXERCISE PROGRAM:   ASSESSMENT:  CLINICAL IMPRESSION: Patient is a 73 y.o. female who was seen today for physical therapy evaluation and treatment for left low back and radiating symptoms. She exhibited reduced left lower extremity muscular strength and diminished sensation. Recommend that she continue with skilled physical therapy to address her impairments to maximize her functional mobility.    OBJECTIVE IMPAIRMENTS: decreased ROM, decreased strength, hypomobility, impaired sensation, impaired tone, and pain.   ACTIVITY LIMITATIONS: bending and locomotion level  PARTICIPATION LIMITATIONS: shopping, community activity, and occupation  PERSONAL FACTORS: Past/current experiences, Profession, Time since onset of injury/illness/exacerbation, and 1 comorbidity: Osteoarthritis  are also affecting patient's functional outcome.   REHAB POTENTIAL: Good  CLINICAL DECISION MAKING: Stable/uncomplicated  EVALUATION COMPLEXITY: Low   GOALS: Goals reviewed with patient? No  LONG TERM GOALS: Target date: 10/27/23  Patient will be independent with her HEP.  Baseline:   Goal status: INITIAL  2.  Patient will be able to complete her daily activities without her familiar symptoms exceeding 4/10. Baseline:  Goal status: INITIAL  3.  Patient will report being able to hike without being limited by her familiar symptoms.  Baseline:  Goal status: INITIAL  4.  Patient will improve her left quadriceps strength to at least 4+/5 for improved function hiking.  Baseline:  Goal status: INITIAL  PLAN:  PT FREQUENCY: 1-2x/week  PT DURATION: 4 weeks  PLANNED INTERVENTIONS: 97164- PT Re-evaluation, 97750- Physical Performance Testing, 97110-Therapeutic exercises, 97530- Therapeutic activity, [id number]- Neuromuscular re-education, 97535- Self Care, 02859- Manual therapy, G0283- Electrical stimulation (unattended), [id number]- Ultrasound, 02987- Traction (mechanical), 79439 (1-2 muscles), 20561 (3+ muscles)- Dry Needling, Patient/Family education, Joint mobilization, Spinal mobilization, Cryotherapy, and Moist heat.  PLAN FOR NEXT SESSION: recumbent bike, manual therapy, assess for dry needling, and lower extremity strengthening   Lacinda JAYSON Fass, PT 09/29/2023, 12:45 PM

## 2023-10-04 ENCOUNTER — Encounter: Payer: Self-pay | Admitting: Physical Therapy

## 2023-10-04 ENCOUNTER — Ambulatory Visit: Admitting: Physical Therapy

## 2023-10-04 DIAGNOSIS — M25552 Pain in left hip: Secondary | ICD-10-CM

## 2023-10-04 DIAGNOSIS — M5416 Radiculopathy, lumbar region: Secondary | ICD-10-CM | POA: Diagnosis not present

## 2023-10-04 DIAGNOSIS — M545 Low back pain, unspecified: Secondary | ICD-10-CM | POA: Diagnosis not present

## 2023-10-04 DIAGNOSIS — M7918 Myalgia, other site: Secondary | ICD-10-CM | POA: Diagnosis not present

## 2023-10-04 NOTE — Therapy (Signed)
 OUTPATIENT PHYSICAL THERAPY THORACOLUMBAR TREATMENT  Patient Name: Carol Hoover MRN: [id number] DOB:10/01/1950, 73 y.o., female Today's Date: 10/04/2023  END OF SESSION:  PT End of Session - 10/04/23 1716     Visit Number 2    Number of Visits 8    Date for PT Re-Evaluation 11/03/23    PT Start Time 0353    PT Stop Time 0451    PT Time Calculation (min) 58 min    Activity Tolerance Patient tolerated treatment well    Behavior During Therapy [facility] for tasks assessed/performed          Past Medical History:  Diagnosis Date   Arthritis    Trigger finger    Past Surgical History:  Procedure Laterality Date   TUBAL LIGATION     Patient Active Problem List   Diagnosis Date Noted   Gluteal tendinitis of left buttock 09/18/2023   Right rotator cuff tear 05/24/2023   Osteoarthritis of knee 12/19/2018   History of deep vein thrombosis 12/19/2018   Acute deep vein thrombosis (DVT) of popliteal vein of left lower extremity (HCC) 04/25/2018   Acute deep vein thrombosis (DVT) of left peroneal vein (HCC) 04/25/2018   Well female exam with routine gynecological exam 03/22/2016   REFERRING PROVIDER: Claudene Arthea HERO, DO   REFERRING DIAG: Gluteal pain, Low back pain, unspecified back pain laterality, unspecified chronicity, unspecified whether sciatica present   Rationale for Evaluation and Treatment: Rehabilitation  THERAPY DIAG:  Radiculopathy, lumbar region  Pain in left hip  ONSET DATE: over 1 year  SUBJECTIVE:                                                                                                                                                                                           SUBJECTIVE STATEMENT: No new complaints.  Continued c/o left anterior thigh numbness.  PERTINENT HISTORY:  Osteoarthritis   PAIN:  Are you having pain? Yes: NPRS scale: Current: 4-5/10 Best: 1-2/10 Worst: 7-8/10 Pain location: left low back and hip Pain description: numbness  and aching Aggravating factors: lifting, squatting, working Relieving factors: medication and dry needling  PRECAUTIONS: None  RED FLAGS: None   WEIGHT BEARING RESTRICTIONS: No  FALLS:  Has patient fallen in last 6 months? No  LIVING ENVIRONMENT: Lives with: lives with an adult companion Lives in: House/apartment Has following equipment at home: None  OCCUPATION: pet groomer  PLOF: Independent  PATIENT GOALS: be able to hike and reduce her symptoms  NEXT MD VISIT: 11/24/23  OBJECTIVE:  Note: Objective measures were completed at Evaluation unless otherwise noted.  PATIENT SURVEYS:  Modified Oswestry:  MODIFIED  OSWESTRY DISABILITY SCALE  Date: 09/29/23 Score  Pain intensity 0 = I can tolerate the pain I have without having to use pain medication.  2. Personal care (washing, dressing, etc.) 0 =  I can take care of myself normally without causing increased pain.  3. Lifting 1 = I can lift heavy weights, but it causes increased pain.  4. Walking 0 = Pain does not prevent me from walking any distance  5. Sitting 2 =  Pain prevents me from sitting more than 1 hour.  6. Standing 0 =  I can stand as long as I want without increased pain.  7. Sleeping 0 = Pain does not prevent me from sleeping well.  8. Social Life 0 = My social life is normal and does not increase my pain.  9. Traveling 1 =  I can travel anywhere, but it increases my pain.  10. Employment/ Homemaking 1 = My normal homemaking/job activities increase my pain, but I can still perform all that is required of me  Total 5/50   Interpretation of scores: Score Category Description  0-20% Minimal Disability The patient can cope with most living activities. Usually no treatment is indicated apart from advice on lifting, sitting and exercise  21-40% Moderate Disability The patient experiences more pain and difficulty with sitting, lifting and standing. Travel and social life are more difficult and they may be disabled from  work. Personal care, sexual activity and sleeping are not grossly affected, and the patient can usually be managed by conservative means  41-60% Severe Disability Pain remains the main problem in this group, but activities of daily living are affected. These patients require a detailed investigation  61-80% Crippled Back pain impinges on all aspects of the patient's life. Positive intervention is required  81-100% Bed-bound  These patients are either bed-bound or exaggerating their symptoms  Bluford FORBES Zoe DELENA Karon DELENA, et al. Surgery versus conservative management of stable thoracolumbar fracture: the PRESTO feasibility RCT. [place] ([place]): [employer]; 2021 Nov. [facility] Technology Assessment, No. 25.62.) Appendix 3, Oswestry Disability Index category descriptors. Available from: FindJewel[employer].cz  Minimally Clinically Important Difference (MCID) = 12.8%  COGNITION: Overall cognitive status: Within functional limits for tasks assessed     SENSATION: Light touch: Impaired with diminished sensation on left L2,4 dermatomes Patient reports that she has some left lateral thigh numbness.   POSTURE: forward head  PALPATION: TTP: left thoracic and lumbar paraspinals, gluteals, and piriformis  LUMBAR JOINT MOBILITY:  L  LUMBAR ROM:   AROM eval  Flexion 66  Extension 12; tightness in low back  Right lateral flexion WFL   Left lateral flexion WFL   Right rotation 25% limited  Left rotation WFL   (Blank rows = not tested)  LOWER EXTREMITY ROM: WFL for activities assessed  LOWER EXTREMITY MMT:    MMT Right eval Left eval  Hip flexion 4/5 4-/5  Hip extension    Hip abduction    Hip adduction    Hip internal rotation    Hip external rotation    Knee flexion 5/5 4/5  Knee extension 4+/5 4/5  Ankle dorsiflexion 4+/5 4+/5  Ankle plantarflexion    Ankle inversion    Ankle eversion     (Blank rows = not tested)  GAIT: Assistive device  utilized: None Level of assistance: Complete Independence Comments: no significant gait deviations observed  TREATMENT DATE:      10/04/23:  Patient in right sdly position with pillows knee for comfort while  receiving combo e'stim/US  at 1.50 W/CM2 x 12 minutes to patient's left lumbar region f/b  STW/M x 11 minutes to left lumbar musculature, QL and piriformis f/b Int lumbar traction at 55# (99 sec hold and 5 sec release to 15#) x 15 minutes.                                                                                                                            PATIENT EDUCATION:  Education details: POC, prognosis, and objective findings Person educated: Patient Education method: Explanation Education comprehension: verbalized understanding  HOME EXERCISE PROGRAM:   ASSESSMENT:  CLINICAL IMPRESSION: Patient presented to the clinic with a CC of left LE symptoms and anterior thigh numbness.  Patient's left QL and piriformis were quite tender to palpation.  Added int traction to treatment today (she has used an inversion table in the past).  She tolerated treatment very well without complaint.   OBJECTIVE IMPAIRMENTS: decreased ROM, decreased strength, hypomobility, impaired sensation, impaired tone, and pain.   ACTIVITY LIMITATIONS: bending and locomotion level  PARTICIPATION LIMITATIONS: shopping, community activity, and occupation  PERSONAL FACTORS: Past/current experiences, Profession, Time since onset of injury/illness/exacerbation, and 1 comorbidity: Osteoarthritis  are also affecting patient's functional outcome.   REHAB POTENTIAL: Good  CLINICAL DECISION MAKING: Stable/uncomplicated  EVALUATION COMPLEXITY: Low   GOALS: Goals reviewed with patient? No  LONG TERM GOALS: Target date: 10/27/23  Patient will be independent with her HEP.  Baseline:  Goal status: INITIAL  2.  Patient will be able to complete her daily activities without her familiar symptoms exceeding  4/10. Baseline:  Goal status: INITIAL  3.  Patient will report being able to hike without being limited by her familiar symptoms.  Baseline:  Goal status: INITIAL  4.  Patient will improve her left quadriceps strength to at least 4+/5 for improved function hiking.  Baseline:  Goal status: INITIAL  PLAN:  PT FREQUENCY: 1-2x/week  PT DURATION: 4 weeks  PLANNED INTERVENTIONS: 97164- PT Re-evaluation, 97750- Physical Performance Testing, 97110-Therapeutic exercises, 97530- Therapeutic activity, [id number]- Neuromuscular re-education, 97535- Self Care, 02859- Manual therapy, G0283- Electrical stimulation (unattended), [id number]- Ultrasound, 02987- Traction (mechanical), 79439 (1-2 muscles), 20561 (3+ muscles)- Dry Needling, Patient/Family education, Joint mobilization, Spinal mobilization, Cryotherapy, and Moist heat.  PLAN FOR NEXT SESSION: recumbent bike, manual therapy, assess for dry needling, and lower extremity strengthening   Maclaine Ahola, [place], PT 10/04/2023, 5:16 PM

## 2023-10-06 ENCOUNTER — Encounter: Payer: Self-pay | Admitting: Physical Therapy

## 2023-10-06 ENCOUNTER — Ambulatory Visit: Attending: Family Medicine | Admitting: Physical Therapy

## 2023-10-06 DIAGNOSIS — M5416 Radiculopathy, lumbar region: Secondary | ICD-10-CM | POA: Diagnosis not present

## 2023-10-06 DIAGNOSIS — M25552 Pain in left hip: Secondary | ICD-10-CM | POA: Insufficient documentation

## 2023-10-06 NOTE — Therapy (Signed)
 OUTPATIENT PHYSICAL THERAPY THORACOLUMBAR TREATMENT  Patient Name: Carol Hoover MRN: [id number] DOB:14-Jan-1951, 73 y.o., female Today's Date: 10/06/2023  END OF SESSION:  PT End of Session - 10/06/23 0932     Visit Number 3    Number of Visits 8    Date for PT Re-Evaluation 11/03/23    PT Start Time 0800    PT Stop Time 0856    PT Time Calculation (min) 56 min    Activity Tolerance Patient tolerated treatment well    Behavior During Therapy [facility] for tasks assessed/performed          Past Medical History:  Diagnosis Date   Arthritis    Trigger finger    Past Surgical History:  Procedure Laterality Date   TUBAL LIGATION     Patient Active Problem List   Diagnosis Date Noted   Gluteal tendinitis of left buttock 09/18/2023   Right rotator cuff tear 05/24/2023   Osteoarthritis of knee 12/19/2018   History of deep vein thrombosis 12/19/2018   Acute deep vein thrombosis (DVT) of popliteal vein of left lower extremity (HCC) 04/25/2018   Acute deep vein thrombosis (DVT) of left peroneal vein (HCC) 04/25/2018   Well female exam with routine gynecological exam 03/22/2016   REFERRING PROVIDER: Claudene Arthea HERO, DO   REFERRING DIAG: Gluteal pain, Low back pain, unspecified back pain laterality, unspecified chronicity, unspecified whether sciatica present   Rationale for Evaluation and Treatment: Rehabilitation  THERAPY DIAG:  Radiculopathy, lumbar region  Pain in left hip  ONSET DATE: over 1 year  SUBJECTIVE:                                                                                                                                                                                           SUBJECTIVE STATEMENT: Woke up with right groin pain last night.    PERTINENT HISTORY:  Osteoarthritis   PAIN:  Are you having pain? Yes: NPRS scale: Current: 4-5/10 Best: 1-2/10 Worst: 7-8/10 Pain location: left low back and hip Pain description: numbness and  aching Aggravating factors: lifting, squatting, working Relieving factors: medication and dry needling  PRECAUTIONS: None  RED FLAGS: None   WEIGHT BEARING RESTRICTIONS: No  FALLS:  Has patient fallen in last 6 months? No  LIVING ENVIRONMENT: Lives with: lives with an adult companion Lives in: House/apartment Has following equipment at home: None  OCCUPATION: pet groomer  PLOF: Independent  PATIENT GOALS: be able to hike and reduce her symptoms  NEXT MD VISIT: 11/24/23  OBJECTIVE:  Note: Objective measures were completed at Evaluation unless otherwise noted.  PATIENT SURVEYS:  Modified Oswestry:  MODIFIED  OSWESTRY DISABILITY SCALE  Date: 09/29/23 Score  Pain intensity 0 = I can tolerate the pain I have without having to use pain medication.  2. Personal care (washing, dressing, etc.) 0 =  I can take care of myself normally without causing increased pain.  3. Lifting 1 = I can lift heavy weights, but it causes increased pain.  4. Walking 0 = Pain does not prevent me from walking any distance  5. Sitting 2 =  Pain prevents me from sitting more than 1 hour.  6. Standing 0 =  I can stand as long as I want without increased pain.  7. Sleeping 0 = Pain does not prevent me from sleeping well.  8. Social Life 0 = My social life is normal and does not increase my pain.  9. Traveling 1 =  I can travel anywhere, but it increases my pain.  10. Employment/ Homemaking 1 = My normal homemaking/job activities increase my pain, but I can still perform all that is required of me  Total 5/50   Interpretation of scores: Score Category Description  0-20% Minimal Disability The patient can cope with most living activities. Usually no treatment is indicated apart from advice on lifting, sitting and exercise  21-40% Moderate Disability The patient experiences more pain and difficulty with sitting, lifting and standing. Travel and social life are more difficult and they may be disabled from  work. Personal care, sexual activity and sleeping are not grossly affected, and the patient can usually be managed by conservative means  41-60% Severe Disability Pain remains the main problem in this group, but activities of daily living are affected. These patients require a detailed investigation  61-80% Crippled Back pain impinges on all aspects of the patient's life. Positive intervention is required  81-100% Bed-bound  These patients are either bed-bound or exaggerating their symptoms  Bluford FORBES Zoe DELENA Karon DELENA, et al. Surgery versus conservative management of stable thoracolumbar fracture: the PRESTO feasibility RCT. [place]): [employer]; 2021 Nov. [facility] Technology Assessment, No. 25.62.) Appendix 3, Oswestry Disability Index category descriptors. Available from: [employer]  Minimally Clinically Important Difference (MCID) = 12.8%  COGNITION: Overall cognitive status: Within functional limits for tasks assessed     SENSATION: Light touch: Impaired with diminished sensation on left L2,4 dermatomes Patient reports that she has some left lateral thigh numbness.   POSTURE: forward head  PALPATION: TTP: left thoracic and lumbar paraspinals, gluteals, and piriformis  LUMBAR JOINT MOBILITY:  L  LUMBAR ROM:   AROM eval  Flexion 66  Extension 12; tightness in low back  Right lateral flexion WFL   Left lateral flexion WFL   Right rotation 25% limited  Left rotation WFL   (Blank rows = not tested)  LOWER EXTREMITY ROM: WFL for activities assessed  LOWER EXTREMITY MMT:    MMT Right eval Left eval  Hip flexion 4/5 4-/5  Hip extension    Hip abduction    Hip adduction    Hip internal rotation    Hip external rotation    Knee flexion 5/5 4/5  Knee extension 4+/5 4/5  Ankle dorsiflexion 4+/5 4+/5  Ankle plantarflexion    Ankle inversion    Ankle eversion     (Blank rows = not tested)  GAIT: Assistive device  utilized: None Level of assistance: Complete Independence Comments: no significant gait deviations observed  TREATMENT DATE:      10/06/23:  Patient in right sdly position with pillows knee for comfort while  receiving combo e'stim/US  at 1.50 W/CM2 x 12 minutes to patient's left lumbar region f/b  STW/M x 11 minutes to left lumbar musculature, QL and piriformis f/b Int lumbar traction at 65# (99 sec hold and 5 sec release to 15#) x 15 minutes.                                                                                                                           10/04/23:  Patient in right sdly position with pillows knee for comfort while receiving combo e'stim/US  at 1.50 W/CM2 x 12 minutes to patient's left lumbar region f/b  STW/M x 11 minutes to left lumbar musculature, QL and piriformis f/b Int lumbar traction at 55# (99 sec hold and 5 sec release to 15#) x 15 minutes.                                                                                                                            PATIENT EDUCATION:  Education details: POC, prognosis, and objective findings Person educated: Patient Education method: Explanation Education comprehension: verbalized understanding  HOME EXERCISE PROGRAM:   ASSESSMENT:  CLINICAL IMPRESSION: Patient did well with traction last session.  She woke up with right groin pain last night.  Increased traction by 10# today with good tolerance.     OBJECTIVE IMPAIRMENTS: decreased ROM, decreased strength, hypomobility, impaired sensation, impaired tone, and pain.   ACTIVITY LIMITATIONS: bending and locomotion level  PARTICIPATION LIMITATIONS: shopping, community activity, and occupation  PERSONAL FACTORS: Past/current experiences, Profession, Time since onset of injury/illness/exacerbation, and 1 comorbidity: Osteoarthritis  are also affecting patient's functional outcome.   REHAB POTENTIAL: Good  CLINICAL DECISION MAKING:  Stable/uncomplicated  EVALUATION COMPLEXITY: Low   GOALS: Goals reviewed with patient? No  LONG TERM GOALS: Target date: 10/27/23  Patient will be independent with her HEP.  Baseline:  Goal status: INITIAL  2.  Patient will be able to complete her daily activities without her familiar symptoms exceeding 4/10. Baseline:  Goal status: INITIAL  3.  Patient will report being able to hike without being limited by her familiar symptoms.  Baseline:  Goal status: INITIAL  4.  Patient will improve her left quadriceps strength to at least 4+/5 for improved function hiking.  Baseline:  Goal status: INITIAL  PLAN:  PT FREQUENCY: 1-2x/week  PT DURATION: 4 weeks  PLANNED INTERVENTIONS: 97164- PT Re-evaluation, 97750- Physical Performance Testing, 97110-Therapeutic exercises, 97530- Therapeutic activity, [id number]-  Neuromuscular re-education, [id number]- Self Care, 02859- Manual therapy, G0283- Electrical stimulation (unattended), [id number]- Ultrasound, [id number]- Traction (mechanical), [phone] (1-2 muscles), [id number] (3+ muscles)- Dry Needling, Patient/Family education, Joint mobilization, Spinal mobilization, Cryotherapy, and Moist heat.  PLAN FOR NEXT SESSION: recumbent bike, manual therapy, assess for dry needling, and lower extremity strengthening   Jaxzen Vanhorn, [place], PT 10/06/2023, 10:47 AM

## 2023-10-11 ENCOUNTER — Ambulatory Visit

## 2023-10-11 DIAGNOSIS — M25552 Pain in left hip: Secondary | ICD-10-CM | POA: Diagnosis not present

## 2023-10-11 DIAGNOSIS — M5416 Radiculopathy, lumbar region: Secondary | ICD-10-CM

## 2023-10-11 NOTE — Therapy (Signed)
 OUTPATIENT PHYSICAL THERAPY THORACOLUMBAR TREATMENT  Patient Name: Carol Hoover MRN: [id number] DOB:[birth year], 73 y.o., female Today's Date: 10/11/2023  END OF SESSION:  PT End of Session - 10/11/23 1607     Visit Number 4    Number of Visits 8    Date for PT Re-Evaluation 11/03/23    PT Start Time 1514    PT Stop Time 1604    PT Time Calculation (min) 50 min    Activity Tolerance Patient tolerated treatment well    Behavior During Therapy [facility] for tasks assessed/performed           Past Medical History:  Diagnosis Date   Arthritis    Trigger finger    Past Surgical History:  Procedure Laterality Date   TUBAL LIGATION     Patient Active Problem List   Diagnosis Date Noted   Gluteal tendinitis of left buttock 09/18/2023   Right rotator cuff tear 05/24/2023   Osteoarthritis of knee 12/19/2018   History of deep vein thrombosis 12/19/2018   Acute deep vein thrombosis (DVT) of popliteal vein of left lower extremity (HCC) 04/25/2018   Acute deep vein thrombosis (DVT) of left peroneal vein (HCC) 04/25/2018   Well female exam with routine gynecological exam 03/22/2016   REFERRING PROVIDER: Claudene Arthea HERO, DO   REFERRING DIAG: Gluteal pain, Low back pain, unspecified back pain laterality, unspecified chronicity, unspecified whether sciatica present   Rationale for Evaluation and Treatment: Rehabilitation  THERAPY DIAG:  Radiculopathy, lumbar region  ONSET DATE: over 1 year  SUBJECTIVE:                                                                                                                                                                                           SUBJECTIVE STATEMENT: She feels that the traction is helping as she has noticed less numbness.   PERTINENT HISTORY:  Osteoarthritis   PAIN:  Are you having pain? Yes: NPRS scale: Current: 4-5/10 Best: 1-2/10 Worst: 7-8/10 Pain location: left low back and hip Pain description: numbness and  aching Aggravating factors: lifting, squatting, working Relieving factors: medication and dry needling  PRECAUTIONS: None  RED FLAGS: None   WEIGHT BEARING RESTRICTIONS: No  FALLS:  Has patient fallen in last 6 months? No  LIVING ENVIRONMENT: Lives with: lives with an adult companion Lives in: House/apartment Has following equipment at home: None  OCCUPATION: pet groomer  PLOF: Independent  PATIENT GOALS: be able to hike and reduce her symptoms  NEXT MD VISIT: 11/24/23  OBJECTIVE:  Note: Objective measures were completed at Evaluation unless otherwise noted.  PATIENT SURVEYS:  Modified Oswestry:  MODIFIED  OSWESTRY DISABILITY SCALE  Date: 09/29/23 Score  Pain intensity 0 = I can tolerate the pain I have without having to use pain medication.  2. Personal care (washing, dressing, etc.) 0 =  I can take care of myself normally without causing increased pain.  3. Lifting 1 = I can lift heavy weights, but it causes increased pain.  4. Walking 0 = Pain does not prevent me from walking any distance  5. Sitting 2 =  Pain prevents me from sitting more than 1 hour.  6. Standing 0 =  I can stand as long as I want without increased pain.  7. Sleeping 0 = Pain does not prevent me from sleeping well.  8. Social Life 0 = My social life is normal and does not increase my pain.  9. Traveling 1 =  I can travel anywhere, but it increases my pain.  10. Employment/ Homemaking 1 = My normal homemaking/job activities increase my pain, but I can still perform all that is required of me  Total 5/50   Interpretation of scores: Score Category Description  0-20% Minimal Disability The patient can cope with most living activities. Usually no treatment is indicated apart from advice on lifting, sitting and exercise  21-40% Moderate Disability The patient experiences more pain and difficulty with sitting, lifting and standing. Travel and social life are more difficult and they may be disabled from  work. Personal care, sexual activity and sleeping are not grossly affected, and the patient can usually be managed by conservative means  41-60% Severe Disability Pain remains the main problem in this group, but activities of daily living are affected. These patients require a detailed investigation  61-80% Crippled Back pain impinges on all aspects of the patient's life. Positive intervention is required  81-100% Bed-bound  These patients are either bed-bound or exaggerating their symptoms  Bluford FORBES Zoe DELENA Karon DELENA, et al. Surgery versus conservative management of stable thoracolumbar fracture: the PRESTO feasibility RCT. [place] ([place]): [employer]; 2021 Nov. [facility] Technology Assessment, No. 25.62.) Appendix 3, Oswestry Disability Index category descriptors. Available from: [employer]cz  Minimally Clinically Important Difference (MCID) = 12.8%  COGNITION: Overall cognitive status: Within functional limits for tasks assessed     SENSATION: Light touch: Impaired with diminished sensation on left L2,4 dermatomes Patient reports that she has some left lateral thigh numbness.   POSTURE: forward head  PALPATION: TTP: left thoracic and lumbar paraspinals, gluteals, and piriformis  LUMBAR JOINT MOBILITY:    LUMBAR ROM:   AROM eval  Flexion 66  Extension 12; tightness in low back  Right lateral flexion WFL   Left lateral flexion WFL   Right rotation 25% limited  Left rotation WFL   (Blank rows = not tested)  LOWER EXTREMITY ROM: WFL for activities assessed  LOWER EXTREMITY MMT:    MMT Right eval Left eval  Hip flexion 4/5 4-/5  Hip extension    Hip abduction    Hip adduction    Hip internal rotation    Hip external rotation    Knee flexion 5/5 4/5  Knee extension 4+/5 4/5  Ankle dorsiflexion 4+/5 4+/5  Ankle plantarflexion    Ankle inversion    Ankle eversion     (Blank rows = not tested)  GAIT: Assistive device  utilized: None Level of assistance: Complete Independence Comments: no significant gait deviations observed  TREATMENT DATE:  10/11/23 EXERCISE LOG  Exercise Repetitions and Resistance Comments  Supine piriformis stretch  3 x 30 seconds each    Supine thomas stretch  3 x 30 seconds   SLR w/ hip ABD  2 x 15 reps each    Single knee to chest 2 x 30 second hold    Supine figure 4 stretch  2 x 30 seconds    Lower trunk rotation 30 reps     Blank cell = exercise not performed today  Modalities: no adverse reaction to today's modalities  Date:  Traction: lumbar, 65# max w/ 99 second hold; 15# rest w/ 5 second hold, 15 mins    10/06/23:  Patient in right sdly position with pillows knee for comfort while receiving combo e'stim/US  at 1.50 W/CM2 x 12 minutes to patient's left lumbar region f/b  STW/M x 11 minutes to left lumbar musculature, QL and piriformis f/b Int lumbar traction at 65# (99 sec hold and 5 sec release to 15#) x 15 minutes.                                                                                                                           10/04/23:  Patient in right sdly position with pillows knee for comfort while receiving combo e'stim/US  at 1.50 W/CM2 x 12 minutes to patient's left lumbar region f/b  STW/M x 11 minutes to left lumbar musculature, QL and piriformis f/b Int lumbar traction at 55# (99 sec hold and 5 sec release to 15#) x 15 minutes.                                                                                                                            PATIENT EDUCATION:  Education details: healing, HEP, traction Person educated: Patient Education method: Explanation Education comprehension: verbalized understanding  HOME EXERCISE PROGRAM:   ASSESSMENT:  CLINICAL IMPRESSION: Patient was introduced to multiple new interventions for improved lumbopelvic mobility.  She required moderate multimodal cueing with straight  leg raises for proper exercise performance to avoid compensatory movement patterns.  She experienced no increase in pain or discomfort with any of today's interventions.  She reported feeling fine upon the conclusion of treatment.  Recommend that she continue with skilled physical therapy to address her remaining impairments to return to her prior level of function.     OBJECTIVE IMPAIRMENTS: decreased ROM, decreased strength, hypomobility, impaired sensation, impaired tone, and pain.  ACTIVITY LIMITATIONS: bending and locomotion level  PARTICIPATION LIMITATIONS: shopping, community activity, and occupation  PERSONAL FACTORS: Past/current experiences, Profession, Time since onset of injury/illness/exacerbation, and 1 comorbidity: Osteoarthritis  are also affecting patient's functional outcome.   REHAB POTENTIAL: Good  CLINICAL DECISION MAKING: Stable/uncomplicated  EVALUATION COMPLEXITY: Low   GOALS: Goals reviewed with patient? No  LONG TERM GOALS: Target date: 10/27/23  Patient will be independent with her HEP.  Baseline:  Goal status: INITIAL  2.  Patient will be able to complete her daily activities without her familiar symptoms exceeding 4/10. Baseline:  Goal status: INITIAL  3.  Patient will report being able to hike without being limited by her familiar symptoms.  Baseline:  Goal status: INITIAL  4.  Patient will improve her left quadriceps strength to at least 4+/5 for improved function hiking.  Baseline:  Goal status: INITIAL  PLAN:  PT FREQUENCY: 1-2x/week  PT DURATION: 4 weeks  PLANNED INTERVENTIONS: 97164- PT Re-evaluation, 97750- Physical Performance Testing, 97110-Therapeutic exercises, 97530- Therapeutic activity, [id number]- Neuromuscular re-education, 97535- Self Care, 02859- Manual therapy, G0283- Electrical stimulation (unattended), [id number]- Ultrasound, 02987- Traction (mechanical), [id number] (1-2 muscles), 20561 (3+ muscles)- Dry Needling, Patient/Family  education, Joint mobilization, Spinal mobilization, Cryotherapy, and Moist heat.  PLAN FOR NEXT SESSION: recumbent bike, manual therapy, assess for dry needling, and lower extremity strengthening   Lacinda JAYSON Fass, PT 10/11/2023, 4:12 PM

## 2023-10-13 ENCOUNTER — Ambulatory Visit: Admitting: Physical Therapy

## 2023-10-13 DIAGNOSIS — M25552 Pain in left hip: Secondary | ICD-10-CM

## 2023-10-13 DIAGNOSIS — M5416 Radiculopathy, lumbar region: Secondary | ICD-10-CM | POA: Diagnosis not present

## 2023-10-13 NOTE — Therapy (Signed)
 OUTPATIENT PHYSICAL THERAPY THORACOLUMBAR TREATMENT  Patient Name: Carol Hoover MRN: [id number] DOB:1950/10/10, 73 y.o., female Today's Date: 10/13/2023  END OF SESSION:  PT End of Session - 10/13/23 0919     Visit Number 5    Number of Visits 8    Date for PT Re-Evaluation 11/03/23    PT Start Time 0800    PT Stop Time 0851    PT Time Calculation (min) 51 min    Activity Tolerance Patient tolerated treatment well    Behavior During Therapy [facility] for tasks assessed/performed           Past Medical History:  Diagnosis Date   Arthritis    Trigger finger    Past Surgical History:  Procedure Laterality Date   TUBAL LIGATION     Patient Active Problem List   Diagnosis Date Noted   Gluteal tendinitis of left buttock 09/18/2023   Right rotator cuff tear 05/24/2023   Osteoarthritis of knee 12/19/2018   History of deep vein thrombosis 12/19/2018   Acute deep vein thrombosis (DVT) of popliteal vein of left lower extremity (HCC) 04/25/2018   Acute deep vein thrombosis (DVT) of left peroneal vein (HCC) 04/25/2018   Well female exam with routine gynecological exam 03/22/2016   REFERRING PROVIDER: Claudene Arthea HERO, DO   REFERRING DIAG: Gluteal pain, Low back pain, unspecified back pain laterality, unspecified chronicity, unspecified whether sciatica present   Rationale for Evaluation and Treatment: Rehabilitation  THERAPY DIAG:  Radiculopathy, lumbar region  Pain in left hip  ONSET DATE: over 1 year  SUBJECTIVE:                                                                                                                                                                                           SUBJECTIVE STATEMENT: Traction is helping.  PERTINENT HISTORY:  Osteoarthritis   PAIN:  Are you having pain? Yes: NPRS scale: Current: 4-5/10 Best: 1-2/10 Worst: 7-8/10 Pain location: left low back and hip Pain description: numbness and aching Aggravating factors: lifting,  squatting, working Relieving factors: medication and dry needling  PRECAUTIONS: None  RED FLAGS: None   WEIGHT BEARING RESTRICTIONS: No  FALLS:  Has patient fallen in last 6 months? No  LIVING ENVIRONMENT: Lives with: lives with an adult companion Lives in: House/apartment Has following equipment at home: None  OCCUPATION: pet groomer  PLOF: Independent  PATIENT GOALS: be able to hike and reduce her symptoms  NEXT MD VISIT: 11/24/23  OBJECTIVE:  Note: Objective measures were completed at Evaluation unless otherwise noted.  PATIENT SURVEYS:  Modified Oswestry:  MODIFIED OSWESTRY DISABILITY SCALE  Date: 09/29/23  Score  Pain intensity 0 = I can tolerate the pain I have without having to use pain medication.  2. Personal care (washing, dressing, etc.) 0 =  I can take care of myself normally without causing increased pain.  3. Lifting 1 = I can lift heavy weights, but it causes increased pain.  4. Walking 0 = Pain does not prevent me from walking any distance  5. Sitting 2 =  Pain prevents me from sitting more than 1 hour.  6. Standing 0 =  I can stand as long as I want without increased pain.  7. Sleeping 0 = Pain does not prevent me from sleeping well.  8. Social Life 0 = My social life is normal and does not increase my pain.  9. Traveling 1 =  I can travel anywhere, but it increases my pain.  10. Employment/ Homemaking 1 = My normal homemaking/job activities increase my pain, but I can still perform all that is required of me  Total 5/50   Interpretation of scores: Score Category Description  0-20% Minimal Disability The patient can cope with most living activities. Usually no treatment is indicated apart from advice on lifting, sitting and exercise  21-40% Moderate Disability The patient experiences more pain and difficulty with sitting, lifting and standing. Travel and social life are more difficult and they may be disabled from work. Personal care, sexual activity and  sleeping are not grossly affected, and the patient can usually be managed by conservative means  41-60% Severe Disability Pain remains the main problem in this group, but activities of daily living are affected. These patients require a detailed investigation  61-80% Crippled Back pain impinges on all aspects of the patient's life. Positive intervention is required  81-100% Bed-bound  These patients are either bed-bound or exaggerating their symptoms  Bluford FORBES Zoe DELENA Karon DELENA, et al. Surgery versus conservative management of stable thoracolumbar fracture: the PRESTO feasibility RCT. [place]): [employer]; 2021 Nov. [facility] Technology Assessment, No. 25.62.) Appendix 3, Oswestry Disability Index category descriptors. Available from: [employer]cz  Minimally Clinically Important Difference (MCID) = 12.8%  COGNITION: Overall cognitive status: Within functional limits for tasks assessed     SENSATION: Light touch: Impaired with diminished sensation on left L2,4 dermatomes Patient reports that she has some left lateral thigh numbness.   POSTURE: forward head  PALPATION: TTP: left thoracic and lumbar paraspinals, gluteals, and piriformis  LUMBAR JOINT MOBILITY:    LUMBAR ROM:   AROM eval  Flexion 66  Extension 12; tightness in low back  Right lateral flexion WFL   Left lateral flexion WFL   Right rotation 25% limited  Left rotation WFL   (Blank rows = not tested)  LOWER EXTREMITY ROM: WFL for activities assessed  LOWER EXTREMITY MMT:    MMT Right eval Left eval  Hip flexion 4/5 4-/5  Hip extension    Hip abduction    Hip adduction    Hip internal rotation    Hip external rotation    Knee flexion 5/5 4/5  Knee extension 4+/5 4/5  Ankle dorsiflexion 4+/5 4+/5  Ankle plantarflexion    Ankle inversion    Ankle eversion     (Blank rows = not tested)  GAIT: Assistive device utilized: None Level of assistance:  Complete Independence Comments: no significant gait deviations observed  TREATMENT DATE:    10/13/23:  Patient in right sdly position with pillow between knees for comfort:  STW/M x 23 minutes to patient's left  lumbar musculature and piriformis f/b Int traction at 65# with 99 sec hold and 5 sec rest x 15 minutes.                                     10/11/23 EXERCISE LOG  Exercise Repetitions and Resistance Comments  Supine piriformis stretch  3 x 30 seconds each    Supine thomas stretch  3 x 30 seconds   SLR w/ hip ABD  2 x 15 reps each    Single knee to chest 2 x 30 second hold    Supine figure 4 stretch  2 x 30 seconds    Lower trunk rotation 30 reps     Blank cell = exercise not performed today  Modalities: no adverse reaction to today's modalities  Date:  Traction: lumbar, 65# max w/ 99 second hold; 15# rest w/ 5 second hold, 15 mins    10/06/23:  Patient in right sdly position with pillows knee for comfort while receiving combo e'stim/US  at 1.50 W/CM2 x 12 minutes to patient's left lumbar region f/b  STW/M x 11 minutes to left lumbar musculature, QL and piriformis f/b Int lumbar traction at 65# (99 sec hold and 5 sec release to 15#) x 15 minutes.                                                                                                                           10/04/23:  Patient in right sdly position with pillows knee for comfort while receiving combo e'stim/US  at 1.50 W/CM2 x 12 minutes to patient's left lumbar region f/b  STW/M x 11 minutes to left lumbar musculature, QL and piriformis f/b Int lumbar traction at 55# (99 sec hold and 5 sec release to 15#) x 15 minutes.                                                                                                                            PATIENT EDUCATION:  Education details: healing, HEP, traction Person educated: Patient Education method: Explanation Education comprehension: verbalized understanding  HOME EXERCISE  PROGRAM:   ASSESSMENT:  CLINICAL IMPRESSION: Excellent response to traction with a reduction in left thigh numbness.   OBJECTIVE IMPAIRMENTS: decreased ROM, decreased strength, hypomobility, impaired sensation, impaired tone, and pain.   ACTIVITY LIMITATIONS: bending and locomotion level  PARTICIPATION LIMITATIONS: shopping, community  activity, and occupation  PERSONAL FACTORS: Past/current experiences, Profession, Time since onset of injury/illness/exacerbation, and 1 comorbidity: Osteoarthritis  are also affecting patient's functional outcome.   REHAB POTENTIAL: Good  CLINICAL DECISION MAKING: Stable/uncomplicated  EVALUATION COMPLEXITY: Low   GOALS: Goals reviewed with patient? No  LONG TERM GOALS: Target date: 10/27/23  Patient will be independent with her HEP.  Baseline:  Goal status: INITIAL  2.  Patient will be able to complete her daily activities without her familiar symptoms exceeding 4/10. Baseline:  Goal status: INITIAL  3.  Patient will report being able to hike without being limited by her familiar symptoms.  Baseline:  Goal status: INITIAL  4.  Patient will improve her left quadriceps strength to at least 4+/5 for improved function hiking.  Baseline:  Goal status: INITIAL  PLAN:  PT FREQUENCY: 1-2x/week  PT DURATION: 4 weeks  PLANNED INTERVENTIONS: 97164- PT Re-evaluation, 97750- Physical Performance Testing, 97110-Therapeutic exercises, 97530- Therapeutic activity, [id number]- Neuromuscular re-education, 97535- Self Care, 02859- Manual therapy, G0283- Electrical stimulation (unattended), [id number]- Ultrasound, 02987- Traction (mechanical), 79439 (1-2 muscles), 20561 (3+ muscles)- Dry Needling, Patient/Family education, Joint mobilization, Spinal mobilization, Cryotherapy, and Moist heat.  PLAN FOR NEXT SESSION: recumbent bike, manual therapy, assess for dry needling, and lower extremity strengthening   Matha Masse, [place], PT 10/13/2023, 9:20 AM

## 2023-10-16 ENCOUNTER — Ambulatory Visit: Admitting: Physical Therapy

## 2023-10-16 ENCOUNTER — Other Ambulatory Visit: Payer: Self-pay

## 2023-10-16 DIAGNOSIS — M5416 Radiculopathy, lumbar region: Secondary | ICD-10-CM | POA: Diagnosis not present

## 2023-10-16 DIAGNOSIS — M25552 Pain in left hip: Secondary | ICD-10-CM | POA: Diagnosis not present

## 2023-10-16 NOTE — Therapy (Signed)
 OUTPATIENT PHYSICAL THERAPY THORACOLUMBAR TREATMENT  Patient Name: Carol Hoover MRN: [id number] DOB:1950-11-14, 73 y.o., female Today's Date: 10/16/2023  END OF SESSION:  PT End of Session - 10/16/23 1459     Visit Number 6    Number of Visits 8    Date for PT Re-Evaluation 11/03/23    PT Start Time 0225    PT Stop Time 0314    PT Time Calculation (min) 49 min    Activity Tolerance Patient tolerated treatment well    Behavior During Therapy [facility] for tasks assessed/performed            Past Medical History:  Diagnosis Date   Arthritis    Trigger finger    Past Surgical History:  Procedure Laterality Date   TUBAL LIGATION     Patient Active Problem List   Diagnosis Date Noted   Gluteal tendinitis of left buttock 09/18/2023   Right rotator cuff tear 05/24/2023   Osteoarthritis of knee 12/19/2018   History of deep vein thrombosis 12/19/2018   Acute deep vein thrombosis (DVT) of popliteal vein of left lower extremity (HCC) 04/25/2018   Acute deep vein thrombosis (DVT) of left peroneal vein (HCC) 04/25/2018   Well female exam with routine gynecological exam 03/22/2016   REFERRING PROVIDER: Claudene Arthea HERO, DO   REFERRING DIAG: Gluteal pain, Low back pain, unspecified back pain laterality, unspecified chronicity, unspecified whether sciatica present   Rationale for Evaluation and Treatment: Rehabilitation  THERAPY DIAG:  Radiculopathy, lumbar region  ONSET DATE: over 1 year  SUBJECTIVE:                                                                                                                                                                                           SUBJECTIVE STATEMENT: Sore from doing a lot around the house. Much less numbness.  PERTINENT HISTORY:  Osteoarthritis   PAIN:  Are you having pain? Yes: NPRS scale: Current: 4-5/10 Best: 1-2/10 Worst: 7-8/10 Pain location: left low back and hip Pain description: numbness and  aching Aggravating factors: lifting, squatting, working Relieving factors: medication and dry needling  PRECAUTIONS: None  RED FLAGS: None   WEIGHT BEARING RESTRICTIONS: No  FALLS:  Has patient fallen in last 6 months? No  LIVING ENVIRONMENT: Lives with: lives with an adult companion Lives in: House/apartment Has following equipment at home: None  OCCUPATION: pet groomer  PLOF: Independent  PATIENT GOALS: be able to hike and reduce her symptoms  NEXT MD VISIT: 11/24/23  OBJECTIVE:  Note: Objective measures were completed at Evaluation unless otherwise noted.  PATIENT SURVEYS:  Modified Oswestry:  MODIFIED OSWESTRY DISABILITY  SCALE  Date: 09/29/23 Score  Pain intensity 0 = I can tolerate the pain I have without having to use pain medication.  2. Personal care (washing, dressing, etc.) 0 =  I can take care of myself normally without causing increased pain.  3. Lifting 1 = I can lift heavy weights, but it causes increased pain.  4. Walking 0 = Pain does not prevent me from walking any distance  5. Sitting 2 =  Pain prevents me from sitting more than 1 hour.  6. Standing 0 =  I can stand as long as I want without increased pain.  7. Sleeping 0 = Pain does not prevent me from sleeping well.  8. Social Life 0 = My social life is normal and does not increase my pain.  9. Traveling 1 =  I can travel anywhere, but it increases my pain.  10. Employment/ Homemaking 1 = My normal homemaking/job activities increase my pain, but I can still perform all that is required of me  Total 5/50   Interpretation of scores: Score Category Description  0-20% Minimal Disability The patient can cope with most living activities. Usually no treatment is indicated apart from advice on lifting, sitting and exercise  21-40% Moderate Disability The patient experiences more pain and difficulty with sitting, lifting and standing. Travel and social life are more difficult and they may be disabled from  work. Personal care, sexual activity and sleeping are not grossly affected, and the patient can usually be managed by conservative means  41-60% Severe Disability Pain remains the main problem in this group, but activities of daily living are affected. These patients require a detailed investigation  61-80% Crippled Back pain impinges on all aspects of the patient's life. Positive intervention is required  81-100% Bed-bound  These patients are either bed-bound or exaggerating their symptoms  Bluford FORBES Zoe DELENA Karon DELENA, et al. Surgery versus conservative management of stable thoracolumbar fracture: the PRESTO feasibility RCT. [place] ([place]): [employer]; 2021 Nov. [facility] Technology Assessment, No. 25.62.) Appendix 3, Oswestry Disability Index category descriptors. Available from: [employer]cz  Minimally Clinically Important Difference (MCID) = 12.8%  COGNITION: Overall cognitive status: Within functional limits for tasks assessed     SENSATION: Light touch: Impaired with diminished sensation on left L2,4 dermatomes Patient reports that she has some left lateral thigh numbness.   POSTURE: forward head  PALPATION: TTP: left thoracic and lumbar paraspinals, gluteals, and piriformis  LUMBAR JOINT MOBILITY:    LUMBAR ROM:   AROM eval  Flexion 66  Extension 12; tightness in low back  Right lateral flexion WFL   Left lateral flexion WFL   Right rotation 25% limited  Left rotation WFL   (Blank rows = not tested)  LOWER EXTREMITY ROM: WFL for activities assessed  LOWER EXTREMITY MMT:    MMT Right eval Left eval  Hip flexion 4/5 4-/5  Hip extension    Hip abduction    Hip adduction    Hip internal rotation    Hip external rotation    Knee flexion 5/5 4/5  Knee extension 4+/5 4/5  Ankle dorsiflexion 4+/5 4+/5  Ankle plantarflexion    Ankle inversion    Ankle eversion     (Blank rows = not tested)  GAIT: Assistive device  utilized: None Level of assistance: Complete Independence Comments: no significant gait deviations observed  TREATMENT DATE:    10/16/23:   Patient in right sdly position with pillow between knees for comfort:  STW/M x  23 minutes to patient's left  lumbar musculature and piriformis f/b Int traction at 65# with 99 sec hold and 5 sec rest x 15 minutes.     10/13/23:  Patient in right sdly position with pillow between knees for comfort:  STW/M x 23 minutes to patient's left  lumbar musculature and piriformis f/b Int traction at 65# with 99 sec hold and 5 sec rest x 15 minutes.                                     10/11/23 EXERCISE LOG  Exercise Repetitions and Resistance Comments  Supine piriformis stretch  3 x 30 seconds each    Supine thomas stretch  3 x 30 seconds   SLR w/ hip ABD  2 x 15 reps each    Single knee to chest 2 x 30 second hold    Supine figure 4 stretch  2 x 30 seconds    Lower trunk rotation 30 reps     Blank cell = exercise not performed today  Modalities: no adverse reaction to today's modalities  Date:  Traction: lumbar, 65# max w/ 99 second hold; 15# rest w/ 5 second hold, 15 mins    10/06/23:  Patient in right sdly position with pillows knee for comfort while receiving combo e'stim/US  at 1.50 W/CM2 x 12 minutes to patient's left lumbar region f/b  STW/M x 11 minutes to left lumbar musculature, QL and piriformis f/b Int lumbar traction at 65# (99 sec hold and 5 sec release to 15#) x 15 minutes.                                                                                                                           10/04/23:  Patient in right sdly position with pillows knee for comfort while receiving combo e'stim/US  at 1.50 W/CM2 x 12 minutes to patient's left lumbar region f/b  STW/M x 11 minutes to left lumbar musculature, QL and piriformis f/b Int lumbar traction at 55# (99 sec hold and 5 sec release to 15#) x 15 minutes.                                                                                                                             PATIENT EDUCATION:  Education details: healing, HEP, traction Person educated: Patient Education method: Hospital doctor  comprehension: verbalized understanding  HOME EXERCISE PROGRAM:   ASSESSMENT:  CLINICAL IMPRESSION: Patient very pleased with progress and reporting a significant decrease in left thigh numbness.  OBJECTIVE IMPAIRMENTS: decreased ROM, decreased strength, hypomobility, impaired sensation, impaired tone, and pain.   ACTIVITY LIMITATIONS: bending and locomotion level  PARTICIPATION LIMITATIONS: shopping, community activity, and occupation  PERSONAL FACTORS: Past/current experiences, Profession, Time since onset of injury/illness/exacerbation, and 1 comorbidity: Osteoarthritis  are also affecting patient's functional outcome.   REHAB POTENTIAL: Good  CLINICAL DECISION MAKING: Stable/uncomplicated  EVALUATION COMPLEXITY: Low   GOALS: Goals reviewed with patient? No  LONG TERM GOALS: Target date: 10/27/23  Patient will be independent with her HEP.  Baseline:  Goal status: INITIAL  2.  Patient will be able to complete her daily activities without her familiar symptoms exceeding 4/10. Baseline:  Goal status: INITIAL  3.  Patient will report being able to hike without being limited by her familiar symptoms.  Baseline:  Goal status: INITIAL  4.  Patient will improve her left quadriceps strength to at least 4+/5 for improved function hiking.  Baseline:  Goal status: INITIAL  PLAN:  PT FREQUENCY: 1-2x/week  PT DURATION: 4 weeks  PLANNED INTERVENTIONS: 97164- PT Re-evaluation, 97750- Physical Performance Testing, 97110-Therapeutic exercises, 97530- Therapeutic activity, [id number]- Neuromuscular re-education, 97535- Self Care, 02859- Manual therapy, G0283- Electrical stimulation (unattended), [id number]- Ultrasound, 02987- Traction (mechanical), 79439 (1-2 muscles), 20561 (3+ muscles)-  Dry Needling, Patient/Family education, Joint mobilization, Spinal mobilization, Cryotherapy, and Moist heat.  PLAN FOR NEXT SESSION: recumbent bike, manual therapy, assess for dry needling, and lower extremity strengthening   Arneda [place], [place], PT 10/16/2023, 3:20 PM

## 2023-10-23 ENCOUNTER — Ambulatory Visit: Admitting: Physical Therapy

## 2023-10-23 DIAGNOSIS — M5416 Radiculopathy, lumbar region: Secondary | ICD-10-CM | POA: Diagnosis not present

## 2023-10-23 DIAGNOSIS — M25552 Pain in left hip: Secondary | ICD-10-CM | POA: Diagnosis not present

## 2023-10-23 NOTE — Therapy (Signed)
 OUTPATIENT PHYSICAL THERAPY THORACOLUMBAR TREATMENT  Patient Name: Carol Hoover MRN: [id number] DOB:09-Jun-1950, 73 y.o., female Today's Date: 10/23/2023  END OF SESSION:  PT End of Session - 10/23/23 1448     Visit Number 7    Number of Visits 8    Date for PT Re-Evaluation 11/03/23    PT Start Time 1255    PT Stop Time 1344    PT Time Calculation (min) 49 min    Activity Tolerance Patient tolerated treatment well    Behavior During Therapy [facility] for tasks assessed/performed            Past Medical History:  Diagnosis Date   Arthritis    Trigger finger    Past Surgical History:  Procedure Laterality Date   TUBAL LIGATION     Patient Active Problem List   Diagnosis Date Noted   Gluteal tendinitis of left buttock 09/18/2023   Right rotator cuff tear 05/24/2023   Osteoarthritis of knee 12/19/2018   History of deep vein thrombosis 12/19/2018   Acute deep vein thrombosis (DVT) of popliteal vein of left lower extremity (HCC) 04/25/2018   Acute deep vein thrombosis (DVT) of left peroneal vein (HCC) 04/25/2018   Well female exam with routine gynecological exam 03/22/2016   REFERRING PROVIDER: Claudene Arthea HERO, DO   REFERRING DIAG: Gluteal pain, Low back pain, unspecified back pain laterality, unspecified chronicity, unspecified whether sciatica present   Rationale for Evaluation and Treatment: Rehabilitation  THERAPY DIAG:  Radiculopathy, lumbar region  Pain in left hip  ONSET DATE: over 1 year  SUBJECTIVE:                                                                                                                                                                                           SUBJECTIVE STATEMENT: Sore from a lot of hiking the last few days but thigh numbness is much better.    PERTINENT HISTORY:  Osteoarthritis   PAIN:  Are you having pain? Yes: NPRS scale: Current: 4-5/10 Best: 1-2/10 Worst: 7-8/10 Pain location: left low back and hip Pain  description: numbness and aching Aggravating factors: lifting, squatting, working Relieving factors: medication and dry needling  PRECAUTIONS: None  RED FLAGS: None   WEIGHT BEARING RESTRICTIONS: No  FALLS:  Has patient fallen in last 6 months? No  LIVING ENVIRONMENT: Lives with: lives with an adult companion Lives in: House/apartment Has following equipment at home: None  OCCUPATION: pet groomer  PLOF: Independent  PATIENT GOALS: be able to hike and reduce her symptoms  NEXT MD VISIT: 11/24/23  OBJECTIVE:  Note: Objective measures were completed at Evaluation unless  otherwise noted.  PATIENT SURVEYS:  Modified Oswestry:  MODIFIED OSWESTRY DISABILITY SCALE  Date: 09/29/23 Score  Pain intensity 0 = I can tolerate the pain I have without having to use pain medication.  2. Personal care (washing, dressing, etc.) 0 =  I can take care of myself normally without causing increased pain.  3. Lifting 1 = I can lift heavy weights, but it causes increased pain.  4. Walking 0 = Pain does not prevent me from walking any distance  5. Sitting 2 =  Pain prevents me from sitting more than 1 hour.  6. Standing 0 =  I can stand as long as I want without increased pain.  7. Sleeping 0 = Pain does not prevent me from sleeping well.  8. Social Life 0 = My social life is normal and does not increase my pain.  9. Traveling 1 =  I can travel anywhere, but it increases my pain.  10. Employment/ Homemaking 1 = My normal homemaking/job activities increase my pain, but I can still perform all that is required of me  Total 5/50   Interpretation of scores: Score Category Description  0-20% Minimal Disability The patient can cope with most living activities. Usually no treatment is indicated apart from advice on lifting, sitting and exercise  21-40% Moderate Disability The patient experiences more pain and difficulty with sitting, lifting and standing. Travel and social life are more difficult and  they may be disabled from work. Personal care, sexual activity and sleeping are not grossly affected, and the patient can usually be managed by conservative means  41-60% Severe Disability Pain remains the main problem in this group, but activities of daily living are affected. These patients require a detailed investigation  61-80% Crippled Back pain impinges on all aspects of the patient's life. Positive intervention is required  81-100% Bed-bound  These patients are either bed-bound or exaggerating their symptoms  Bluford FORBES Zoe DELENA Karon DELENA, et al. Surgery versus conservative management of stable thoracolumbar fracture: the PRESTO feasibility RCT. [place] ([place]): [employer]; 2021 Nov. [facility] Technology Assessment, No. 25.62.) Appendix 3, Oswestry Disability Index category descriptors. Available from: [employer]  Minimally Clinically Important Difference (MCID) = 12.8%  COGNITION: Overall cognitive status: Within functional limits for tasks assessed     SENSATION: Light touch: Impaired with diminished sensation on left L2,4 dermatomes Patient reports that she has some left lateral thigh numbness.   POSTURE: forward head  PALPATION: TTP: left thoracic and lumbar paraspinals, gluteals, and piriformis  LUMBAR JOINT MOBILITY:    LUMBAR ROM:   AROM eval  Flexion 66  Extension 12; tightness in low back  Right lateral flexion WFL   Left lateral flexion WFL   Right rotation 25% limited  Left rotation WFL   (Blank rows = not tested)  LOWER EXTREMITY ROM: WFL for activities assessed  LOWER EXTREMITY MMT:    MMT Right eval Left eval  Hip flexion 4/5 4-/5  Hip extension    Hip abduction    Hip adduction    Hip internal rotation    Hip external rotation    Knee flexion 5/5 4/5  Knee extension 4+/5 4/5  Ankle dorsiflexion 4+/5 4+/5  Ankle plantarflexion    Ankle inversion    Ankle eversion     (Blank rows = not  tested)  GAIT: Assistive device utilized: None Level of assistance: Complete Independence Comments: no significant gait deviations observed  TREATMENT DATE:    10/23/23:   Patient in  right sdly position with pillow between knees for comfort:  STW/M x 23 minutes to patient's left  lumbar musculature and piriformis f/b Int traction at 65# with 99 sec hold and 5 sec rest x 15 minutes.      10/16/23:   Patient in right sdly position with pillow between knees for comfort:  STW/M x 23 minutes to patient's left  lumbar musculature and piriformis f/b Int traction at 65# with 99 sec hold and 5 sec rest x 15 minutes.     10/13/23:  Patient in right sdly position with pillow between knees for comfort:  STW/M x 23 minutes to patient's left  lumbar musculature and piriformis f/b Int traction at 65# with 99 sec hold and 5 sec rest x 15 minutes.                                    PATIENT EDUCATION:  Education details: healing, HEP, traction Person educated: Patient Education method: Explanation Education comprehension: verbalized understanding  HOME EXERCISE PROGRAM:   ASSESSMENT:  CLINICAL IMPRESSION: Patient doing very well with a consistent and significant decrease in left anterior thigh numbness.  OBJECTIVE IMPAIRMENTS: decreased ROM, decreased strength, hypomobility, impaired sensation, impaired tone, and pain.   ACTIVITY LIMITATIONS: bending and locomotion level  PARTICIPATION LIMITATIONS: shopping, community activity, and occupation  PERSONAL FACTORS: Past/current experiences, Profession, Time since onset of injury/illness/exacerbation, and 1 comorbidity: Osteoarthritis  are also affecting patient's functional outcome.   REHAB POTENTIAL: Good  CLINICAL DECISION MAKING: Stable/uncomplicated  EVALUATION COMPLEXITY: Low   GOALS: Goals reviewed with patient? No  LONG TERM GOALS: Target date: 10/27/23  Patient will be independent with her HEP.  Baseline:  Goal status:  INITIAL  2.  Patient will be able to complete her daily activities without her familiar symptoms exceeding 4/10. Baseline:  Goal status: INITIAL  3.  Patient will report being able to hike without being limited by her familiar symptoms.  Baseline:  Goal status: INITIAL  4.  Patient will improve her left quadriceps strength to at least 4+/5 for improved function hiking.  Baseline:  Goal status: INITIAL  PLAN:  PT FREQUENCY: 1-2x/week  PT DURATION: 4 weeks  PLANNED INTERVENTIONS: 97164- PT Re-evaluation, 97750- Physical Performance Testing, 97110-Therapeutic exercises, 97530- Therapeutic activity, [id number]- Neuromuscular re-education, 97535- Self Care, 02859- Manual therapy, [id number]- Electrical stimulation (unattended), [id number]- Ultrasound, 02987- Traction (mechanical), 79439 (1-2 muscles), 20561 (3+ muscles)- Dry Needling, Patient/Family education, Joint mobilization, Spinal mobilization, Cryotherapy, and Moist heat.  PLAN FOR NEXT SESSION: recumbent bike, manual therapy, assess for dry needling, and lower extremity strengthening   Tavin Vernet, [place], PT 10/23/2023, 2:49 PM

## 2023-10-27 ENCOUNTER — Ambulatory Visit

## 2023-10-27 DIAGNOSIS — M5416 Radiculopathy, lumbar region: Secondary | ICD-10-CM

## 2023-10-27 DIAGNOSIS — M25552 Pain in left hip: Secondary | ICD-10-CM

## 2023-10-27 NOTE — Therapy (Signed)
 OUTPATIENT PHYSICAL THERAPY THORACOLUMBAR TREATMENT  Patient Name: ALANI SABBAGH MRN: [id number] DOB:12-19-1950, 73 y.o., female Today's Date: 10/27/2023  END OF SESSION:  PT End of Session - 10/27/23 0804     Visit Number 8    Number of Visits 12    Date for PT Re-Evaluation 12/01/23    PT Start Time 0801    PT Stop Time 0900    PT Time Calculation (min) 59 min    Activity Tolerance Patient tolerated treatment well    Behavior During Therapy [facility] for tasks assessed/performed             Past Medical History:  Diagnosis Date   Arthritis    Trigger finger    Past Surgical History:  Procedure Laterality Date   TUBAL LIGATION     Patient Active Problem List   Diagnosis Date Noted   Gluteal tendinitis of left buttock 09/18/2023   Right rotator cuff tear 05/24/2023   Osteoarthritis of knee 12/19/2018   History of deep vein thrombosis 12/19/2018   Acute deep vein thrombosis (DVT) of popliteal vein of left lower extremity (HCC) 04/25/2018   Acute deep vein thrombosis (DVT) of left peroneal vein (HCC) 04/25/2018   Well female exam with routine gynecological exam 03/22/2016   REFERRING PROVIDER: Claudene Arthea HERO, DO   REFERRING DIAG: Gluteal pain, Low back pain, unspecified back pain laterality, unspecified chronicity, unspecified whether sciatica present   Rationale for Evaluation and Treatment: Rehabilitation  THERAPY DIAG:  Radiculopathy, lumbar region  Pain in left hip  ONSET DATE: over 1 year  SUBJECTIVE:                                                                                                                                                                                           SUBJECTIVE STATEMENT: Patient reports that she had a little numbness this morning, but she thinks it is from picking up her dogs. She feels like she is about 95% back to her prior level of function.   PERTINENT HISTORY:  Osteoarthritis   PAIN:  Are you having pain? Yes:  NPRS scale: Current: 1/10 Best: 1-2/10 Worst: 7-8/10 Pain location: left low back and hip Pain description: numbness and aching Aggravating factors: lifting, squatting, working Relieving factors: medication and dry needling  PRECAUTIONS: None  RED FLAGS: None   WEIGHT BEARING RESTRICTIONS: No  FALLS:  Has patient fallen in last 6 months? No  LIVING ENVIRONMENT: Lives with: lives with an adult companion Lives in: House/apartment Has following equipment at home: None  OCCUPATION: pet groomer  PLOF: Independent  PATIENT GOALS: be able to hike and reduce  her symptoms  NEXT MD VISIT: 11/24/23  OBJECTIVE:  Note: Objective measures were completed at Evaluation unless otherwise noted.  PATIENT SURVEYS:  Modified Oswestry:  MODIFIED OSWESTRY DISABILITY SCALE  Date: 09/29/23 Score  Pain intensity 0 = I can tolerate the pain I have without having to use pain medication.  2. Personal care (washing, dressing, etc.) 0 =  I can take care of myself normally without causing increased pain.  3. Lifting 1 = I can lift heavy weights, but it causes increased pain.  4. Walking 0 = Pain does not prevent me from walking any distance  5. Sitting 2 =  Pain prevents me from sitting more than 1 hour.  6. Standing 0 =  I can stand as long as I want without increased pain.  7. Sleeping 0 = Pain does not prevent me from sleeping well.  8. Social Life 0 = My social life is normal and does not increase my pain.  9. Traveling 1 =  I can travel anywhere, but it increases my pain.  10. Employment/ Homemaking 1 = My normal homemaking/job activities increase my pain, but I can still perform all that is required of me  Total 5/50   Interpretation of scores: Score Category Description  0-20% Minimal Disability The patient can cope with most living activities. Usually no treatment is indicated apart from advice on lifting, sitting and exercise  21-40% Moderate Disability The patient experiences more pain and  difficulty with sitting, lifting and standing. Travel and social life are more difficult and they may be disabled from work. Personal care, sexual activity and sleeping are not grossly affected, and the patient can usually be managed by conservative means  41-60% Severe Disability Pain remains the main problem in this group, but activities of daily living are affected. These patients require a detailed investigation  61-80% Crippled Back pain impinges on all aspects of the patient's life. Positive intervention is required  81-100% Bed-bound  These patients are either bed-bound or exaggerating their symptoms  Bluford FORBES Zoe DELENA Karon DELENA, et al. Surgery versus conservative management of stable thoracolumbar fracture: the PRESTO feasibility RCT. [place] ([place]): [employer]; 2021 Nov. [facility] Technology Assessment, No. 25.62.) Appendix 3, Oswestry Disability Index category descriptors. Available from: [employer]cz  Minimally Clinically Important Difference (MCID) = 12.8%  COGNITION: Overall cognitive status: Within functional limits for tasks assessed     SENSATION: Light touch: Impaired with diminished sensation on left L2,4 dermatomes Patient reports that she has some left lateral thigh numbness.   POSTURE: forward head  PALPATION: TTP: left thoracic and lumbar paraspinals, gluteals, and piriformis  LUMBAR JOINT MOBILITY:    LUMBAR ROM:   AROM eval  Flexion 66  Extension 12; tightness in low back  Right lateral flexion WFL   Left lateral flexion WFL   Right rotation 25% limited  Left rotation WFL   (Blank rows = not tested)  LOWER EXTREMITY ROM: WFL for activities assessed  LOWER EXTREMITY MMT:    MMT Right eval Left eval  Hip flexion 4/5 4-/5  Hip extension    Hip abduction    Hip adduction    Hip internal rotation    Hip external rotation    Knee flexion 5/5 4/5  Knee extension 4+/5 4/5  Ankle dorsiflexion 4+/5 4+/5   Ankle plantarflexion    Ankle inversion    Ankle eversion     (Blank rows = not tested)  GAIT: Assistive device utilized: None Level of assistance: Complete  Independence Comments: no significant gait deviations observed  TREATMENT DATE:                                     10/27/23 EXERCISE LOG  Exercise Repetitions and Resistance Comments  Goal assessment  See below    Figure 4 stretch  4 x 30 seconds   Bridge  20 reps w/ 5 second hold    Manual therapy  See below        Blank cell = exercise not performed today  Manual Therapy Soft Tissue Mobilization: left piriformis and gluteals, for improved soft tissue extensibility  Modalities: no adverse reaction to today's modalities  Date:  Traction: lumbar, 75# w/ 99 second hold with 5 second rest at 5#, 15 mins   10/23/23:   Patient in right sdly position with pillow between knees for comfort:  STW/M x 23 minutes to patient's left  lumbar musculature and piriformis f/b Int traction at 65# with 99 sec hold and 5 sec rest x 15 minutes.      10/16/23:   Patient in right sdly position with pillow between knees for comfort:  STW/M x 23 minutes to patient's left  lumbar musculature and piriformis f/b Int traction at 65# with 99 sec hold and 5 sec rest x 15 minutes.    PATIENT EDUCATION:  Education details: healing, HEP, traction Person educated: Patient Education method: Explanation Education comprehension: verbalized understanding  HOME EXERCISE PROGRAM:   ASSESSMENT:  CLINICAL IMPRESSION: Patient is making good progress with skilled physical therapy as evidenced by her subjective reports, muscular strength, functional mobility, and progress toward her goals. She was able to meet most of her initial goals for physical therapy. However, her symptoms continue to be reproduced with lifting her dog. Her goals were updated to reflect this continued limitation. Today's treatment focused on familiar interventions due to her increase in  symptoms after lifting her dog earlier this morning with good results. She reported feeling better upon the conclusion of treatment. Recommend that she continue skilled physical therapy for four additional visits to address her remaining impairments to return to her prior level of function.    OBJECTIVE IMPAIRMENTS: decreased ROM, decreased strength, hypomobility, impaired sensation, impaired tone, and pain.   ACTIVITY LIMITATIONS: bending and locomotion level  PARTICIPATION LIMITATIONS: shopping, community activity, and occupation  PERSONAL FACTORS: Past/current experiences, Profession, Time since onset of injury/illness/exacerbation, and 1 comorbidity: Osteoarthritis  are also affecting patient's functional outcome.   REHAB POTENTIAL: Good  CLINICAL DECISION MAKING: Stable/uncomplicated  EVALUATION COMPLEXITY: Low   GOALS: Goals reviewed with patient? No  LONG TERM GOALS: Target date: 10/27/23  Patient will be independent with her HEP.  Baseline: has not tried the inversion table yet Goal status: ON GOING  2.  Patient will be able to complete her daily activities without her familiar symptoms exceeding 4/10. Baseline:  Goal status: MET  3.  Patient will report being able to hike without being limited by her familiar symptoms.  Baseline:  Goal status: MET  4.  Patient will improve her left quadriceps strength to at least 4+/5 for improved function hiking.  Baseline: 4+/5 Goal status: MET  5.  Patient will be able to squat and lift her dogs (about 40 pounds) without reproducing her familiar symptoms.  Baseline:  Goal status: INITIAL   PLAN:  PT FREQUENCY: 1-2x/week  PT DURATION: 4 weeks  PLANNED INTERVENTIONS: 02835- PT  Re-evaluation, 97750- Physical Performance Testing, 97110-Therapeutic exercises, 97530- Therapeutic activity, [id number]- Neuromuscular re-education, 97535- Self Care, 02859- Manual therapy, G0283- Electrical stimulation (unattended), 97035- Ultrasound,  [id number]- Traction (mechanical), [phone] (1-2 muscles), 20561 (3+ muscles)- Dry Needling, Patient/Family education, Joint mobilization, Spinal mobilization, Cryotherapy, and Moist heat.  PLAN FOR NEXT SESSION: recumbent bike, manual therapy, assess for dry needling, and lower extremity strengthening   Lacinda JAYSON Fass, PT 10/27/2023, 11:58 AM

## 2023-10-30 ENCOUNTER — Encounter: Payer: Self-pay | Admitting: Physical Therapy

## 2023-11-02 ENCOUNTER — Ambulatory Visit: Admitting: Physical Therapy

## 2023-11-02 DIAGNOSIS — M5416 Radiculopathy, lumbar region: Secondary | ICD-10-CM

## 2023-11-02 DIAGNOSIS — M25552 Pain in left hip: Secondary | ICD-10-CM

## 2023-11-02 NOTE — Therapy (Signed)
 OUTPATIENT PHYSICAL THERAPY THORACOLUMBAR TREATMENT  Patient Name: Carol Hoover MRN: [id number] DOB:20-Jun-1950, 73 y.o., female Today's Date: 11/02/2023  END OF SESSION:  PT End of Session - 11/02/23 1551     Visit Number 9    Number of Visits 12    Date for PT Re-Evaluation 12/01/23    PT Start Time 0315    PT Stop Time 0407    PT Time Calculation (min) 52 min    Activity Tolerance Patient tolerated treatment well    Behavior During Therapy [facility] for tasks assessed/performed              Past Medical History:  Diagnosis Date   Arthritis    Trigger finger    Past Surgical History:  Procedure Laterality Date   TUBAL LIGATION     Patient Active Problem List   Diagnosis Date Noted   Gluteal tendinitis of left buttock 09/18/2023   Right rotator cuff tear 05/24/2023   Osteoarthritis of knee 12/19/2018   History of deep vein thrombosis 12/19/2018   Acute deep vein thrombosis (DVT) of popliteal vein of left lower extremity (HCC) 04/25/2018   Acute deep vein thrombosis (DVT) of left peroneal vein (HCC) 04/25/2018   Well female exam with routine gynecological exam 03/22/2016   REFERRING PROVIDER: Claudene Arthea HERO, DO   REFERRING DIAG: Gluteal pain, Low back pain, unspecified back pain laterality, unspecified chronicity, unspecified whether sciatica present   Rationale for Evaluation and Treatment: Rehabilitation  THERAPY DIAG:  Radiculopathy, lumbar region  Pain in left hip  ONSET DATE: over 1 year  SUBJECTIVE:                                                                                                                                                                                           SUBJECTIVE STATEMENT: Left thigh is much better.  Some Piriformis pain.  PERTINENT HISTORY:  Osteoarthritis   PAIN:  Are you having pain? Yes: NPRS scale: Current: 1/10 Best: 1-2/10 Worst: 7-8/10 Pain location: left low back and hip Pain description: numbness and  aching Aggravating factors: lifting, squatting, working Relieving factors: medication and dry needling  PRECAUTIONS: None  RED FLAGS: None   WEIGHT BEARING RESTRICTIONS: No  FALLS:  Has patient fallen in last 6 months? No  LIVING ENVIRONMENT: Lives with: lives with an adult companion Lives in: House/apartment Has following equipment at home: None  OCCUPATION: pet groomer  PLOF: Independent  PATIENT GOALS: be able to hike and reduce her symptoms  NEXT MD VISIT: 11/24/23  OBJECTIVE:  Note: Objective measures were completed at Evaluation unless otherwise noted.  PATIENT SURVEYS:  Modified  Oswestry:  MODIFIED OSWESTRY DISABILITY SCALE  Date: 09/29/23 Score  Pain intensity 0 = I can tolerate the pain I have without having to use pain medication.  2. Personal care (washing, dressing, etc.) 0 =  I can take care of myself normally without causing increased pain.  3. Lifting 1 = I can lift heavy weights, but it causes increased pain.  4. Walking 0 = Pain does not prevent me from walking any distance  5. Sitting 2 =  Pain prevents me from sitting more than 1 hour.  6. Standing 0 =  I can stand as long as I want without increased pain.  7. Sleeping 0 = Pain does not prevent me from sleeping well.  8. Social Life 0 = My social life is normal and does not increase my pain.  9. Traveling 1 =  I can travel anywhere, but it increases my pain.  10. Employment/ Homemaking 1 = My normal homemaking/job activities increase my pain, but I can still perform all that is required of me  Total 5/50   Interpretation of scores: Score Category Description  0-20% Minimal Disability The patient can cope with most living activities. Usually no treatment is indicated apart from advice on lifting, sitting and exercise  21-40% Moderate Disability The patient experiences more pain and difficulty with sitting, lifting and standing. Travel and social life are more difficult and they may be disabled from  work. Personal care, sexual activity and sleeping are not grossly affected, and the patient can usually be managed by conservative means  41-60% Severe Disability Pain remains the main problem in this group, but activities of daily living are affected. These patients require a detailed investigation  61-80% Crippled Back pain impinges on all aspects of the patient's life. Positive intervention is required  81-100% Bed-bound  These patients are either bed-bound or exaggerating their symptoms  Bluford FORBES Zoe DELENA Karon DELENA, et al. Surgery versus conservative management of stable thoracolumbar fracture: the PRESTO feasibility RCT. [place] ([place]): [employer]; 2021 Nov. [facility] Technology Assessment, No. 25.62.) Appendix 3, Oswestry Disability Index category descriptors. Available from: [employer]cz  Minimally Clinically Important Difference (MCID) = 12.8%  COGNITION: Overall cognitive status: Within functional limits for tasks assessed     SENSATION: Light touch: Impaired with diminished sensation on left L2,4 dermatomes Patient reports that she has some left lateral thigh numbness.   POSTURE: forward head  PALPATION: TTP: left thoracic and lumbar paraspinals, gluteals, and piriformis  LUMBAR JOINT MOBILITY:    LUMBAR ROM:   AROM eval  Flexion 66  Extension 12; tightness in low back  Right lateral flexion WFL   Left lateral flexion WFL   Right rotation 25% limited  Left rotation WFL   (Blank rows = not tested)  LOWER EXTREMITY ROM: WFL for activities assessed  LOWER EXTREMITY MMT:    MMT Right eval Left eval  Hip flexion 4/5 4-/5  Hip extension    Hip abduction    Hip adduction    Hip internal rotation    Hip external rotation    Knee flexion 5/5 4/5  Knee extension 4+/5 4/5  Ankle dorsiflexion 4+/5 4+/5  Ankle plantarflexion    Ankle inversion    Ankle eversion     (Blank rows = not tested)  GAIT: Assistive device  utilized: None Level of assistance: Complete Independence Comments: no significant gait deviations observed  TREATMENT DATE:    11/02/23:  Right sdly position:  STW/M x 23 minutes to patient's  left Piriformis region with ischemic release technique f/b Int traction at 75# x 15 minutes.                                     10/27/23 EXERCISE LOG  Exercise Repetitions and Resistance Comments  Goal assessment  See below    Figure 4 stretch  4 x 30 seconds   Bridge  20 reps w/ 5 second hold    Manual therapy  See below        Blank cell = exercise not performed today  Manual Therapy Soft Tissue Mobilization: left piriformis and gluteals, for improved soft tissue extensibility  Modalities: no adverse reaction to today's modalities  Date:  Traction: lumbar, 75# w/ 99 second hold with 5 second rest at 5#, 15 mins   10/23/23:   Patient in right sdly position with pillow between knees for comfort:  STW/M x 23 minutes to patient's left  lumbar musculature and piriformis f/b Int traction at 65# with 99 sec hold and 5 sec rest x 15 minutes.      10/16/23:   Patient in right sdly position with pillow between knees for comfort:  STW/M x 23 minutes to patient's left  lumbar musculature and piriformis f/b Int traction at 65# with 99 sec hold and 5 sec rest x 15 minutes.    PATIENT EDUCATION:  Education details: healing, HEP, traction Person educated: Patient Education method: Explanation Education comprehension: verbalized understanding  HOME EXERCISE PROGRAM:   ASSESSMENT:  CLINICAL IMPRESSION: Patient reporting left thigh is much better.  CC is pain in left Piriformis region.  She responded well to STW/M and did well with traction at 75#.  OBJECTIVE IMPAIRMENTS: decreased ROM, decreased strength, hypomobility, impaired sensation, impaired tone, and pain.   ACTIVITY LIMITATIONS: bending and locomotion level  PARTICIPATION LIMITATIONS: shopping, community activity, and occupation  PERSONAL  FACTORS: Past/current experiences, Profession, Time since onset of injury/illness/exacerbation, and 1 comorbidity: Osteoarthritis  are also affecting patient's functional outcome.   REHAB POTENTIAL: Good  CLINICAL DECISION MAKING: Stable/uncomplicated  EVALUATION COMPLEXITY: Low   GOALS: Goals reviewed with patient? No  LONG TERM GOALS: Target date: 10/27/23  Patient will be independent with her HEP.  Baseline: has not tried the inversion table yet Goal status: ON GOING  2.  Patient will be able to complete her daily activities without her familiar symptoms exceeding 4/10. Baseline:  Goal status: MET  3.  Patient will report being able to hike without being limited by her familiar symptoms.  Baseline:  Goal status: MET  4.  Patient will improve her left quadriceps strength to at least 4+/5 for improved function hiking.  Baseline: 4+/5 Goal status: MET  5.  Patient will be able to squat and lift her dogs (about 40 pounds) without reproducing her familiar symptoms.  Baseline:  Goal status: INITIAL   PLAN:  PT FREQUENCY: 1-2x/week  PT DURATION: 4 weeks  PLANNED INTERVENTIONS: 97164- PT Re-evaluation, 97750- Physical Performance Testing, 97110-Therapeutic exercises, 97530- Therapeutic activity, [id number]- Neuromuscular re-education, 97535- Self Care, 02859- Manual therapy, G0283- Electrical stimulation (unattended), [id number]- Ultrasound, 02987- Traction (mechanical), 79439 (1-2 muscles), 20561 (3+ muscles)- Dry Needling, Patient/Family education, Joint mobilization, Spinal mobilization, Cryotherapy, and Moist heat.  PLAN FOR NEXT SESSION: recumbent bike, manual therapy, assess for dry needling, and lower extremity strengthening   Evanthia Maund, [place], PT 11/02/2023, 4:40 PM

## 2023-11-09 ENCOUNTER — Ambulatory Visit: Attending: Family Medicine | Admitting: Physical Therapy

## 2023-11-09 DIAGNOSIS — M5416 Radiculopathy, lumbar region: Secondary | ICD-10-CM | POA: Insufficient documentation

## 2023-11-09 DIAGNOSIS — M25552 Pain in left hip: Secondary | ICD-10-CM | POA: Insufficient documentation

## 2023-11-09 NOTE — Therapy (Addendum)
 OUTPATIENT PHYSICAL THERAPY THORACOLUMBAR TREATMENT  Patient Name: Carol Hoover MRN: [id number] DOB:[birth year], 73 y.o., female Today's Date: 11/09/2023  END OF SESSION:  PT End of Session - 11/09/23 1659     Visit Number 10    Number of Visits 12    Date for PT Re-Evaluation 12/01/23    PT Start Time 0315    PT Stop Time 0410    PT Time Calculation (min) 55 min    Activity Tolerance Patient tolerated treatment well    Behavior During Therapy [facility] for tasks assessed/performed               Past Medical History:  Diagnosis Date   Arthritis    Trigger finger    Past Surgical History:  Procedure Laterality Date   TUBAL LIGATION     Patient Active Problem List   Diagnosis Date Noted   Gluteal tendinitis of left buttock 09/18/2023   Right rotator cuff tear 05/24/2023   Osteoarthritis of knee 12/19/2018   History of deep vein thrombosis 12/19/2018   Acute deep vein thrombosis (DVT) of popliteal vein of left lower extremity (HCC) 04/25/2018   Acute deep vein thrombosis (DVT) of left peroneal vein (HCC) 04/25/2018   Well female exam with routine gynecological exam 03/22/2016   REFERRING PROVIDER: Claudene Arthea HERO, DO   REFERRING DIAG: Gluteal pain, Low back pain, unspecified back pain laterality, unspecified chronicity, unspecified whether sciatica present   Rationale for Evaluation and Treatment: Rehabilitation  THERAPY DIAG:  Radiculopathy, lumbar region  Pain in left hip  ONSET DATE: over 1 year  SUBJECTIVE:                                                                                                                                                                                           SUBJECTIVE STATEMENT: Patient requesting dry needling to her left Piriformis.   PERTINENT HISTORY:  Osteoarthritis   PAIN:  Are you having pain? Yes: NPRS scale: Current: 1/10 Best: 1-2/10 Worst: 7-8/10 Pain location: left low back and hip Pain description: numbness  and aching Aggravating factors: lifting, squatting, working Relieving factors: medication and dry needling  PRECAUTIONS: None  RED FLAGS: None   WEIGHT BEARING RESTRICTIONS: No  FALLS:  Has patient fallen in last 6 months? No  LIVING ENVIRONMENT: Lives with: lives with an adult companion Lives in: House/apartment Has following equipment at home: None  OCCUPATION: pet groomer  PLOF: Independent  PATIENT GOALS: be able to hike and reduce her symptoms  NEXT MD VISIT: 11/24/23  OBJECTIVE:  Note: Objective measures were completed at Evaluation unless otherwise noted.  PATIENT SURVEYS:  Modified Oswestry:  MODIFIED OSWESTRY DISABILITY SCALE  Date: 09/29/23 Score  Pain intensity 0 = I can tolerate the pain I have without having to use pain medication.  2. Personal care (washing, dressing, etc.) 0 =  I can take care of myself normally without causing increased pain.  3. Lifting 1 = I can lift heavy weights, but it causes increased pain.  4. Walking 0 = Pain does not prevent me from walking any distance  5. Sitting 2 =  Pain prevents me from sitting more than 1 hour.  6. Standing 0 =  I can stand as long as I want without increased pain.  7. Sleeping 0 = Pain does not prevent me from sleeping well.  8. Social Life 0 = My social life is normal and does not increase my pain.  9. Traveling 1 =  I can travel anywhere, but it increases my pain.  10. Employment/ Homemaking 1 = My normal homemaking/job activities increase my pain, but I can still perform all that is required of me  Total 5/50   Interpretation of scores: Score Category Description  0-20% Minimal Disability The patient can cope with most living activities. Usually no treatment is indicated apart from advice on lifting, sitting and exercise  21-40% Moderate Disability The patient experiences more pain and difficulty with sitting, lifting and standing. Travel and social life are more difficult and they may be disabled from  work. Personal care, sexual activity and sleeping are not grossly affected, and the patient can usually be managed by conservative means  41-60% Severe Disability Pain remains the main problem in this group, but activities of daily living are affected. These patients require a detailed investigation  61-80% Crippled Back pain impinges on all aspects of the patient's life. Positive intervention is required  81-100% Bed-bound  These patients are either bed-bound or exaggerating their symptoms  Bluford FORBES Zoe DELENA Karon DELENA, et al. Surgery versus conservative management of stable thoracolumbar fracture: the PRESTO feasibility RCT. [place] ([place]): [employer]; 2021 Nov. [facility] Technology Assessment, No. 25.62.) Appendix 3, Oswestry Disability Index category descriptors. Available from: [employer]el[employer]cz  Minimally Clinically Important Difference (MCID) = 12.8%  COGNITION: Overall cognitive status: Within functional limits for tasks assessed     SENSATION: Light touch: Impaired with diminished sensation on left L2,4 dermatomes Patient reports that she has some left lateral thigh numbness.   POSTURE: forward head  PALPATION: TTP: left thoracic and lumbar paraspinals, gluteals, and piriformis  LUMBAR JOINT MOBILITY:    LUMBAR ROM:   AROM eval  Flexion 66  Extension 12; tightness in low back  Right lateral flexion WFL   Left lateral flexion WFL   Right rotation 25% limited  Left rotation WFL   (Blank rows = not tested)  LOWER EXTREMITY ROM: WFL for activities assessed  LOWER EXTREMITY MMT:    MMT Right eval Left eval  Hip flexion 4/5 4-/5  Hip extension    Hip abduction    Hip adduction    Hip internal rotation    Hip external rotation    Knee flexion 5/5 4/5  Knee extension 4+/5 4/5  Ankle dorsiflexion 4+/5 4+/5  Ankle plantarflexion    Ankle inversion    Ankle eversion     (Blank rows = not tested)  GAIT: Assistive device  utilized: None Level of assistance: Complete Independence Comments: no significant gait deviations observed  TREATMENT DATE:   11/09/23:     Trigger Point Dry Needling  Patient Verbal  Consent Given: Yes Education Handout Provided: Yes Muscles Treated: Left distal Piriformis Treatment Response/Outcome: Twitch........f/b US  at 1.50 W/CM2 x 12 minutes f/b STW/M x 12 minutes f/b Int traction at 75# x 15 minutes.    Trigger Point Dry Needling  What is Trigger Point Dry Needling (DN)? DN is a physical therapy technique used to treat muscle pain and dysfunction. Specifically, DN helps deactivate muscle trigger points (muscle knots).  A thin filiform needle is used to penetrate the skin and stimulate the underlying trigger point. The goal is for a local twitch response (LTR) to occur and for the trigger point to relax. No medication of any kind is injected during the procedure.  Benefits of Trigger Point Dry Needling Reduces localized tension and stiffness promoting muscle function. Promotes range of motion and flexibility of the area being treated. Relieves localized and referred muscle related pain.  Promotes localized blood flow. Benefits of DN increase when performed with formal therapy including strengthening and stretching.   What Does Trigger Point Dry Needling Feel Like?  The procedure feels different for each individual patient. Some patients report that they do not actually feel the needle enter the skin and overall the process is not painful. Very mild bleeding may occur. However, many patients feel a deep cramping in the muscle in which the needle was inserted. This is the local twitch response.   How Will I feel after the treatment? Soreness is normal, and the onset of soreness may not occur for a few hours. Typically this soreness does not last longer than two days.  Bruising is uncommon, however; ice can be used to decrease any possible bruising.  In rare cases feeling tired or  nauseous after the treatment is normal. In addition, your symptoms may get worse before they get better, this period will typically not last longer than 24 hours.   What Can I do After My Treatment? Increase your hydration by drinking more water for the next 24 hours.  You may place ice or heat on the areas treated that have become sore, however, do not use heat on inflamed or bruised areas. Heat often brings more relief post needling. You can continue your regular activities, but vigorous activity is not recommended initially after the treatment for 24 hours. DN is best combined with other physical therapy such as strengthening, stretching, and other therapies.   What are the complications? While your therapist has had extensive training in minimizing the risks of trigger point dry needling, it is important to understand the risks of any procedure.  Risks include bleeding, pain, fatigue, hematoma, infection, vertigo, nausea or nerve involvement. Monitor for any changes to your skin or sensation. Contact your therapist or MD with concerns.  A rare but serious complication is a pneumothorax over or near your middle and upper chest and back If you have dry needling in this area, monitor for the following symptoms: Shortness of breath on exertion and/or Difficulty taking a deep breath and/or Chest Pain and/or A dry cough If any of the above symptoms develop, please go to the nearest emergency room or call 911. Tell them you had dry needling over your thorax and report any symptoms you are having. Please follow-up with your treating therapist after you complete the medical evaluation.     11/02/23:  Right sdly position:  STW/M x 23 minutes to patient's left Piriformis region with ischemic release technique f/b Int traction at 75# x 15 minutes.  10/27/23 EXERCISE LOG  Exercise Repetitions and Resistance Comments  Goal assessment  See below    Figure 4 stretch  4  x 30 seconds   Bridge  20 reps w/ 5 second hold    Manual therapy  See below        Blank cell = exercise not performed today  Manual Therapy Soft Tissue Mobilization: left piriformis and gluteals, for improved soft tissue extensibility  Modalities: no adverse reaction to today's modalities  Date:  Traction: lumbar, 75# w/ 99 second hold with 5 second rest at 5#, 15 mins   10/23/23:   Patient in right sdly position with pillow between knees for comfort:  STW/M x 23 minutes to patient's left  lumbar musculature and piriformis f/b Int traction at 65# with 99 sec hold and 5 sec rest x 15 minutes.      10/16/23:   Patient in right sdly position with pillow between knees for comfort:  STW/M x 23 minutes to patient's left  lumbar musculature and piriformis f/b Int traction at 65# with 99 sec hold and 5 sec rest x 15 minutes.    PATIENT EDUCATION:  Education details: healing, HEP, traction Person educated: Patient Education method: Explanation Education comprehension: verbalized understanding  HOME EXERCISE PROGRAM:   ASSESSMENT:  CLINICAL IMPRESSION: Patient had an excellent twitch response with dry needling to her left proximal piriformis near the greater trochanter.    OBJECTIVE IMPAIRMENTS: decreased ROM, decreased strength, hypomobility, impaired sensation, impaired tone, and pain.   ACTIVITY LIMITATIONS: bending and locomotion level  PARTICIPATION LIMITATIONS: shopping, community activity, and occupation  PERSONAL FACTORS: Past/current experiences, Profession, Time since onset of injury/illness/exacerbation, and 1 comorbidity: Osteoarthritis  are also affecting patient's functional outcome.   REHAB POTENTIAL: Good  CLINICAL DECISION MAKING: Stable/uncomplicated  EVALUATION COMPLEXITY: Low   GOALS: Goals reviewed with patient? No  LONG TERM GOALS: Target date: 10/27/23  Patient will be independent with her HEP.  Baseline: has not tried the inversion table yet Goal  status: ON GOING  2.  Patient will be able to complete her daily activities without her familiar symptoms exceeding 4/10. Baseline:  Goal status: MET  3.  Patient will report being able to hike without being limited by her familiar symptoms.  Baseline:  Goal status: MET  4.  Patient will improve her left quadriceps strength to at least 4+/5 for improved function hiking.  Baseline: 4+/5 Goal status: MET  5.  Patient will be able to squat and lift her dogs (about 40 pounds) without reproducing her familiar symptoms.  Baseline:  Goal status: INITIAL   PLAN:  PT FREQUENCY: 1-2x/week  PT DURATION: 4 weeks  PLANNED INTERVENTIONS: 97164- PT Re-evaluation, 97750- Physical Performance Testing, 97110-Therapeutic exercises, 97530- Therapeutic activity, [id number]- Neuromuscular re-education, 97535- Self Care, 02859- Manual therapy, G0283- Electrical stimulation (unattended), [id number]- Ultrasound, 02987- Traction (mechanical), 79439 (1-2 muscles), 20561 (3+ muscles)- Dry Needling, Patient/Family education, Joint mobilization, Spinal mobilization, Cryotherapy, and Moist heat.  PLAN FOR NEXT SESSION: recumbent bike, manual therapy, assess for dry needling, and lower extremity strengthening  Progress Note Reporting Period 09/29/23 to 11/09/23  See note below for Objective Data and Assessment of Progress/Goals. Very good progress with LTG's #2, 3 and 4 met at this time.    Abryana Lykens, [place], PT 11/09/2023, 5:12 PM

## 2023-11-13 DIAGNOSIS — R110 Nausea: Secondary | ICD-10-CM | POA: Diagnosis not present

## 2023-11-13 DIAGNOSIS — R0789 Other chest pain: Secondary | ICD-10-CM | POA: Diagnosis not present

## 2023-11-13 DIAGNOSIS — R42 Dizziness and giddiness: Secondary | ICD-10-CM | POA: Diagnosis not present

## 2023-11-13 DIAGNOSIS — E038 Other specified hypothyroidism: Secondary | ICD-10-CM | POA: Diagnosis not present

## 2023-11-15 ENCOUNTER — Ambulatory Visit: Admitting: Physical Therapy

## 2023-11-15 DIAGNOSIS — M5416 Radiculopathy, lumbar region: Secondary | ICD-10-CM | POA: Diagnosis not present

## 2023-11-15 DIAGNOSIS — M25552 Pain in left hip: Secondary | ICD-10-CM

## 2023-11-15 NOTE — Therapy (Signed)
 OUTPATIENT PHYSICAL THERAPY THORACOLUMBAR TREATMENT  Patient Name: Carol Hoover MRN: [id number] DOB:[birth year], 73 y.o., female Today's Date: 11/15/2023  END OF SESSION:  PT End of Session - 11/15/23 1655     Visit Number 11    Number of Visits 12    Date for PT Re-Evaluation 12/01/23    PT Start Time 0400    PT Stop Time 0457    PT Time Calculation (min) 57 min    Activity Tolerance Patient tolerated treatment well    Behavior During Therapy [facility] for tasks assessed/performed               Past Medical History:  Diagnosis Date   Arthritis    Trigger finger    Past Surgical History:  Procedure Laterality Date   TUBAL LIGATION     Patient Active Problem List   Diagnosis Date Noted   Gluteal tendinitis of left buttock 09/18/2023   Right rotator cuff tear 05/24/2023   Osteoarthritis of knee 12/19/2018   History of deep vein thrombosis 12/19/2018   Acute deep vein thrombosis (DVT) of popliteal vein of left lower extremity (HCC) 04/25/2018   Acute deep vein thrombosis (DVT) of left peroneal vein (HCC) 04/25/2018   Well female exam with routine gynecological exam 03/22/2016   REFERRING PROVIDER: Claudene Arthea HERO, DO   REFERRING DIAG: Gluteal pain, Low back pain, unspecified back pain laterality, unspecified chronicity, unspecified whether sciatica present   Rationale for Evaluation and Treatment: Rehabilitation  THERAPY DIAG:  Radiculopathy, lumbar region  Pain in left hip  ONSET DATE: over 1 year  SUBJECTIVE:                                                                                                                                                                                           SUBJECTIVE STATEMENT: Very good response to dry needling some minor soreness remaining. PERTINENT HISTORY:  Osteoarthritis   PAIN:  Are you having pain? Yes: NPRS scale: Current: 1/10 Best: 1-2/10 Worst: 7-8/10 Pain location: left low back and hip Pain description:  numbness and aching Aggravating factors: lifting, squatting, working Relieving factors: medication and dry needling  PRECAUTIONS: None  RED FLAGS: None   WEIGHT BEARING RESTRICTIONS: No  FALLS:  Has patient fallen in last 6 months? No  LIVING ENVIRONMENT: Lives with: lives with an adult companion Lives in: House/apartment Has following equipment at home: None  OCCUPATION: pet groomer  PLOF: Independent  PATIENT GOALS: be able to hike and reduce her symptoms  NEXT MD VISIT: 11/24/23  OBJECTIVE:  Note: Objective measures were completed at Evaluation unless otherwise noted.  PATIENT SURVEYS:  Modified Oswestry:  MODIFIED OSWESTRY DISABILITY SCALE  Date: 09/29/23 Score  Pain intensity 0 = I can tolerate the pain I have without having to use pain medication.  2. Personal care (washing, dressing, etc.) 0 =  I can take care of myself normally without causing increased pain.  3. Lifting 1 = I can lift heavy weights, but it causes increased pain.  4. Walking 0 = Pain does not prevent me from walking any distance  5. Sitting 2 =  Pain prevents me from sitting more than 1 hour.  6. Standing 0 =  I can stand as long as I want without increased pain.  7. Sleeping 0 = Pain does not prevent me from sleeping well.  8. Social Life 0 = My social life is normal and does not increase my pain.  9. Traveling 1 =  I can travel anywhere, but it increases my pain.  10. Employment/ Homemaking 1 = My normal homemaking/job activities increase my pain, but I can still perform all that is required of me  Total 5/50   Interpretation of scores: Score Category Description  0-20% Minimal Disability The patient can cope with most living activities. Usually no treatment is indicated apart from advice on lifting, sitting and exercise  21-40% Moderate Disability The patient experiences more pain and difficulty with sitting, lifting and standing. Travel and social life are more difficult and they may be  disabled from work. Personal care, sexual activity and sleeping are not grossly affected, and the patient can usually be managed by conservative means  41-60% Severe Disability Pain remains the main problem in this group, but activities of daily living are affected. These patients require a detailed investigation  61-80% Crippled Back pain impinges on all aspects of the patient's life. Positive intervention is required  81-100% Bed-bound  These patients are either bed-bound or exaggerating their symptoms  Bluford FORBES Zoe DELENA Karon DELENA, et al. Surgery versus conservative management of stable thoracolumbar fracture: the PRESTO feasibility RCT. [place] ([place]): [employer]; 2021 Nov. [facility] Technology Assessment, No. 25.62.) Appendix 3, Oswestry Disability Index category descriptors. Available from: [employer]cz  Minimally Clinically Important Difference (MCID) = 12.8%  COGNITION: Overall cognitive status: Within functional limits for tasks assessed     SENSATION: Light touch: Impaired with diminished sensation on left L2,4 dermatomes Patient reports that she has some left lateral thigh numbness.   POSTURE: forward head  PALPATION: TTP: left thoracic and lumbar paraspinals, gluteals, and piriformis  LUMBAR JOINT MOBILITY:    LUMBAR ROM:   AROM eval  Flexion 66  Extension 12; tightness in low back  Right lateral flexion WFL   Left lateral flexion WFL   Right rotation 25% limited  Left rotation WFL   (Blank rows = not tested)  LOWER EXTREMITY ROM: WFL for activities assessed  LOWER EXTREMITY MMT:    MMT Right eval Left eval  Hip flexion 4/5 4-/5  Hip extension    Hip abduction    Hip adduction    Hip internal rotation    Hip external rotation    Knee flexion 5/5 4/5  Knee extension 4+/5 4/5  Ankle dorsiflexion 4+/5 4+/5  Ankle plantarflexion    Ankle inversion    Ankle eversion     (Blank rows = not  tested)  GAIT: Assistive device utilized: None Level of assistance: Complete Independence Comments: no significant gait deviations observed  TREATMENT DATE:   11/15/23:  Combo e'stim/US  at 1.50 W/CM2 x 12 minutes f/b STW/M  x 11 minutes to left Piriformis f/b Int traction at 75# x 15 minutes.    11/09/23:     Trigger Point Dry Needling  Patient Verbal Consent Given: Yes Education Handout Provided: Yes Muscles Treated: Left distal Piriformis Treatment Response/Outcome: Twitch........f/b US  at 1.50 W/CM2 x 12 minutes f/b STW/M x 12 minutes f/b Int traction at 75# x 15 minutes.    Trigger Point Dry Needling  What is Trigger Point Dry Needling (DN)? DN is a physical therapy technique used to treat muscle pain and dysfunction. Specifically, DN helps deactivate muscle trigger points (muscle knots).  A thin filiform needle is used to penetrate the skin and stimulate the underlying trigger point. The goal is for a local twitch response (LTR) to occur and for the trigger point to relax. No medication of any kind is injected during the procedure.  Benefits of Trigger Point Dry Needling Reduces localized tension and stiffness promoting muscle function. Promotes range of motion and flexibility of the area being treated. Relieves localized and referred muscle related pain.  Promotes localized blood flow. Benefits of DN increase when performed with formal therapy including strengthening and stretching.   What Does Trigger Point Dry Needling Feel Like?  The procedure feels different for each individual patient. Some patients report that they do not actually feel the needle enter the skin and overall the process is not painful. Very mild bleeding may occur. However, many patients feel a deep cramping in the muscle in which the needle was inserted. This is the local twitch response.   How Will I feel after the treatment? Soreness is normal, and the onset of soreness may not occur for a few hours.  Typically this soreness does not last longer than two days.  Bruising is uncommon, however; ice can be used to decrease any possible bruising.  In rare cases feeling tired or nauseous after the treatment is normal. In addition, your symptoms may get worse before they get better, this period will typically not last longer than 24 hours.   What Can I do After My Treatment? Increase your hydration by drinking more water for the next 24 hours.  You may place ice or heat on the areas treated that have become sore, however, do not use heat on inflamed or bruised areas. Heat often brings more relief post needling. You can continue your regular activities, but vigorous activity is not recommended initially after the treatment for 24 hours. DN is best combined with other physical therapy such as strengthening, stretching, and other therapies.   What are the complications? While your therapist has had extensive training in minimizing the risks of trigger point dry needling, it is important to understand the risks of any procedure.  Risks include bleeding, pain, fatigue, hematoma, infection, vertigo, nausea or nerve involvement. Monitor for any changes to your skin or sensation. Contact your therapist or MD with concerns.  A rare but serious complication is a pneumothorax over or near your middle and upper chest and back If you have dry needling in this area, monitor for the following symptoms: Shortness of breath on exertion and/or Difficulty taking a deep breath and/or Chest Pain and/or A dry cough If any of the above symptoms develop, please go to the nearest emergency room or call 911. Tell them you had dry needling over your thorax and report any symptoms you are having. Please follow-up with your treating therapist after you complete the medical evaluation.     11/02/23:  Right sdly position:  STW/M x 23 minutes to patient's left Piriformis region with ischemic release technique f/b Int traction at  75# x 15 minutes.                                     10/27/23 EXERCISE LOG  Exercise Repetitions and Resistance Comments  Goal assessment  See below    Figure 4 stretch  4 x 30 seconds   Bridge  20 reps w/ 5 second hold    Manual therapy  See below        Blank cell = exercise not performed today  Manual Therapy Soft Tissue Mobilization: left piriformis and gluteals, for improved soft tissue extensibility  Modalities: no adverse reaction to today's modalities  Date:  Traction: lumbar, 75# w/ 99 second hold with 5 second rest at 5#, 15 mins   10/23/23:   Patient in right sdly position with pillow between knees for comfort:  STW/M x 23 minutes to patient's left  lumbar musculature and piriformis f/b Int traction at 65# with 99 sec hold and 5 sec rest x 15 minutes.      10/16/23:   Patient in right sdly position with pillow between knees for comfort:  STW/M x 23 minutes to patient's left  lumbar musculature and piriformis f/b Int traction at 65# with 99 sec hold and 5 sec rest x 15 minutes.    PATIENT EDUCATION:  Education details: healing, HEP, traction Person educated: Patient Education method: Explanation Education comprehension: verbalized understanding  HOME EXERCISE PROGRAM:   ASSESSMENT:  CLINICAL IMPRESSION: Patient doing very well with only mild left Piriformis tenderness reported today.    OBJECTIVE IMPAIRMENTS: decreased ROM, decreased strength, hypomobility, impaired sensation, impaired tone, and pain.   ACTIVITY LIMITATIONS: bending and locomotion level  PARTICIPATION LIMITATIONS: shopping, community activity, and occupation  PERSONAL FACTORS: Past/current experiences, Profession, Time since onset of injury/illness/exacerbation, and 1 comorbidity: Osteoarthritis  are also affecting patient's functional outcome.   REHAB POTENTIAL: Good  CLINICAL DECISION MAKING: Stable/uncomplicated  EVALUATION COMPLEXITY: Low   GOALS: Goals reviewed with patient?  No  LONG TERM GOALS: Target date: 10/27/23  Patient will be independent with her HEP.  Baseline: has not tried the inversion table yet Goal status: ON GOING  2.  Patient will be able to complete her daily activities without her familiar symptoms exceeding 4/10. Baseline:  Goal status: MET  3.  Patient will report being able to hike without being limited by her familiar symptoms.  Baseline:  Goal status: MET  4.  Patient will improve her left quadriceps strength to at least 4+/5 for improved function hiking.  Baseline: 4+/5 Goal status: MET  5.  Patient will be able to squat and lift her dogs (about 40 pounds) without reproducing her familiar symptoms.  Baseline:  Goal status: INITIAL   PLAN:  PT FREQUENCY: 1-2x/week  PT DURATION: 4 weeks  PLANNED INTERVENTIONS: 97164- PT Re-evaluation, 97750- Physical Performance Testing, 97110-Therapeutic exercises, 97530- Therapeutic activity, [id number]- Neuromuscular re-education, 97535- Self Care, 02859- Manual therapy, [id number]- Electrical stimulation (unattended), [id number]- Ultrasound, 02987- Traction (mechanical), 79439 (1-2 muscles), 20561 (3+ muscles)- Dry Needling, Patient/Family education, Joint mobilization, Spinal mobilization, Cryotherapy, and Moist heat.  PLAN FOR NEXT SESSION: recumbent bike, manual therapy, assess for dry needling, and lower extremity strengthening      Maximillion Gill, [place], PT 11/15/2023, 5:06 PM

## 2023-11-17 DIAGNOSIS — M25561 Pain in right knee: Secondary | ICD-10-CM | POA: Diagnosis not present

## 2023-11-17 DIAGNOSIS — M1711 Unilateral primary osteoarthritis, right knee: Secondary | ICD-10-CM | POA: Diagnosis not present

## 2023-11-21 ENCOUNTER — Ambulatory Visit: Admitting: Physical Therapy

## 2023-11-21 DIAGNOSIS — M5416 Radiculopathy, lumbar region: Secondary | ICD-10-CM | POA: Diagnosis not present

## 2023-11-21 NOTE — Therapy (Signed)
 OUTPATIENT PHYSICAL THERAPY THORACOLUMBAR TREATMENT  Patient Name: Carol Hoover MRN: [id number] DOB:02/09/1951, 73 y.o., female Today's Date: 11/21/2023  END OF SESSION:  PT End of Session - 11/21/23 1633     Visit Number 12    Number of Visits 12    Date for PT Re-Evaluation 12/01/23    PT Start Time 0315    PT Stop Time 0419    PT Time Calculation (min) 64 min    Activity Tolerance Patient tolerated treatment well    Behavior During Therapy [facility] for tasks assessed/performed                Past Medical History:  Diagnosis Date   Arthritis    Trigger finger    Past Surgical History:  Procedure Laterality Date   TUBAL LIGATION     Patient Active Problem List   Diagnosis Date Noted   Gluteal tendinitis of left buttock 09/18/2023   Right rotator cuff tear 05/24/2023   Osteoarthritis of knee 12/19/2018   History of deep vein thrombosis 12/19/2018   Acute deep vein thrombosis (DVT) of popliteal vein of left lower extremity (HCC) 04/25/2018   Acute deep vein thrombosis (DVT) of left peroneal vein (HCC) 04/25/2018   Well female exam with routine gynecological exam 03/22/2016   REFERRING PROVIDER: Claudene Arthea HERO, DO   REFERRING DIAG: Gluteal pain, Low back pain, unspecified back pain laterality, unspecified chronicity, unspecified whether sciatica present   Rationale for Evaluation and Treatment: Rehabilitation  THERAPY DIAG:  Radiculopathy, lumbar region  ONSET DATE: over 1 year  SUBJECTIVE:                                                                                                                                                                                           SUBJECTIVE STATEMENT:Patient tired today from due a lot of yardwork.  Requesting dry needling.    PERTINENT HISTORY:  Osteoarthritis   PAIN:  Are you having pain? Yes: NPRS scale: Current: 1/10 Best: 1-2/10 Worst: 7-8/10 Pain location: left low back and hip Pain description: numbness  and aching Aggravating factors: lifting, squatting, working Relieving factors: medication and dry needling  PRECAUTIONS: None  RED FLAGS: None   WEIGHT BEARING RESTRICTIONS: No  FALLS:  Has patient fallen in last 6 months? No  LIVING ENVIRONMENT: Lives with: lives with an adult companion Lives in: House/apartment Has following equipment at home: None  OCCUPATION: pet groomer  PLOF: Independent  PATIENT GOALS: be able to hike and reduce her symptoms  NEXT MD VISIT: 11/24/23  OBJECTIVE:  Note: Objective measures were completed at Evaluation unless otherwise noted.  PATIENT SURVEYS:  Modified Oswestry:  MODIFIED OSWESTRY DISABILITY SCALE  Date: 09/29/23 Score  Pain intensity 0 = I can tolerate the pain I have without having to use pain medication.  2. Personal care (washing, dressing, etc.) 0 =  I can take care of myself normally without causing increased pain.  3. Lifting 1 = I can lift heavy weights, but it causes increased pain.  4. Walking 0 = Pain does not prevent me from walking any distance  5. Sitting 2 =  Pain prevents me from sitting more than 1 hour.  6. Standing 0 =  I can stand as long as I want without increased pain.  7. Sleeping 0 = Pain does not prevent me from sleeping well.  8. Social Life 0 = My social life is normal and does not increase my pain.  9. Traveling 1 =  I can travel anywhere, but it increases my pain.  10. Employment/ Homemaking 1 = My normal homemaking/job activities increase my pain, but I can still perform all that is required of me  Total 5/50   Interpretation of scores: Score Category Description  0-20% Minimal Disability The patient can cope with most living activities. Usually no treatment is indicated apart from advice on lifting, sitting and exercise  21-40% Moderate Disability The patient experiences more pain and difficulty with sitting, lifting and standing. Travel and social life are more difficult and they may be disabled from  work. Personal care, sexual activity and sleeping are not grossly affected, and the patient can usually be managed by conservative means  41-60% Severe Disability Pain remains the main problem in this group, but activities of daily living are affected. These patients require a detailed investigation  61-80% Crippled Back pain impinges on all aspects of the patient's life. Positive intervention is required  81-100% Bed-bound  These patients are either bed-bound or exaggerating their symptoms  Bluford FORBES Zoe DELENA Karon DELENA, et al. Surgery versus conservative management of stable thoracolumbar fracture: the PRESTO feasibility RCT. [place] ([place]): [employer]; 2021 Nov. [facility] Technology Assessment, No. 25.62.) Appendix 3, Oswestry Disability Index category descriptors. Available from: FindJewel[employer]cz  Minimally Clinically Important Difference (MCID) = 12.8%  COGNITION: Overall cognitive status: Within functional limits for tasks assessed     SENSATION: Light touch: Impaired with diminished sensation on left L2,4 dermatomes Patient reports that she has some left lateral thigh numbness.   POSTURE: forward head  PALPATION: TTP: left thoracic and lumbar paraspinals, gluteals, and piriformis  LUMBAR JOINT MOBILITY:    LUMBAR ROM:   AROM eval  Flexion 66  Extension 12; tightness in low back  Right lateral flexion WFL   Left lateral flexion WFL   Right rotation 25% limited  Left rotation WFL   (Blank rows = not tested)  LOWER EXTREMITY ROM: WFL for activities assessed  LOWER EXTREMITY MMT:    MMT Right eval Left eval  Hip flexion 4/5 4-/5  Hip extension    Hip abduction    Hip adduction    Hip internal rotation    Hip external rotation    Knee flexion 5/5 4/5  Knee extension 4+/5 4/5  Ankle dorsiflexion 4+/5 4+/5  Ankle plantarflexion    Ankle inversion    Ankle eversion     (Blank rows = not tested)  GAIT: Assistive device  utilized: None Level of assistance: Complete Independence Comments: no significant gait deviations observed  TREATMENT DATE:   11/21/23:  Muscles Treated: Left distal Piriformis near greater trochanter attachment Treatment  Response/Outcome: Twitch........f/b US  at 1.50 W/CM2 x 12 minutes f/b STW/M x 12 minutes f/b Int traction at 75# x 15 minutes.     11/15/23:  Combo e'stim/US  at 1.50 W/CM2 x 12 minutes f/b STW/M x 11 minutes to left Piriformis f/b Int traction at 75# x 15 minutes.    11/09/23:     Trigger Point Dry Needling  Patient Verbal Consent Given: Yes Education Handout Provided: Yes Muscles Treated: Left distal Piriformis Treatment Response/Outcome: Twitch........f/b US  at 1.50 W/CM2 x 12 minutes f/b STW/M x 12 minutes f/b Int traction at 75# x 15 minutes.    Trigger Point Dry Needling  What is Trigger Point Dry Needling (DN)? DN is a physical therapy technique used to treat muscle pain and dysfunction. Specifically, DN helps deactivate muscle trigger points (muscle knots).  A thin filiform needle is used to penetrate the skin and stimulate the underlying trigger point. The goal is for a local twitch response (LTR) to occur and for the trigger point to relax. No medication of any kind is injected during the procedure.  Benefits of Trigger Point Dry Needling Reduces localized tension and stiffness promoting muscle function. Promotes range of motion and flexibility of the area being treated. Relieves localized and referred muscle related pain.  Promotes localized blood flow. Benefits of DN increase when performed with formal therapy including strengthening and stretching.   What Does Trigger Point Dry Needling Feel Like?  The procedure feels different for each individual patient. Some patients report that they do not actually feel the needle enter the skin and overall the process is not painful. Very mild bleeding may occur. However, many patients feel a deep cramping in the  muscle in which the needle was inserted. This is the local twitch response.   How Will I feel after the treatment? Soreness is normal, and the onset of soreness may not occur for a few hours. Typically this soreness does not last longer than two days.  Bruising is uncommon, however; ice can be used to decrease any possible bruising.  In rare cases feeling tired or nauseous after the treatment is normal. In addition, your symptoms may get worse before they get better, this period will typically not last longer than 24 hours.   What Can I do After My Treatment? Increase your hydration by drinking more water for the next 24 hours.  You may place ice or heat on the areas treated that have become sore, however, do not use heat on inflamed or bruised areas. Heat often brings more relief post needling. You can continue your regular activities, but vigorous activity is not recommended initially after the treatment for 24 hours. DN is best combined with other physical therapy such as strengthening, stretching, and other therapies.   What are the complications? While your therapist has had extensive training in minimizing the risks of trigger point dry needling, it is important to understand the risks of any procedure.  Risks include bleeding, pain, fatigue, hematoma, infection, vertigo, nausea or nerve involvement. Monitor for any changes to your skin or sensation. Contact your therapist or MD with concerns.  A rare but serious complication is a pneumothorax over or near your middle and upper chest and back If you have dry needling in this area, monitor for the following symptoms: Shortness of breath on exertion and/or Difficulty taking a deep breath and/or Chest Pain and/or A dry cough If any of the above symptoms develop, please go to the nearest emergency room or call 911.  Tell them you had dry needling over your thorax and report any symptoms you are having. Please follow-up with your treating  therapist after you complete the medical evaluation.     11/02/23:  Right sdly position:  STW/M x 23 minutes to patient's left Piriformis region with ischemic release technique f/b Int traction at 75# x 15 minutes.                                     10/27/23 EXERCISE LOG  Exercise Repetitions and Resistance Comments  Goal assessment  See below    Figure 4 stretch  4 x 30 seconds   Bridge  20 reps w/ 5 second hold    Manual therapy  See below        Blank cell = exercise not performed today  Manual Therapy Soft Tissue Mobilization: left piriformis and gluteals, for improved soft tissue extensibility  Modalities: no adverse reaction to today's modalities  Date:  Traction: lumbar, 75# w/ 99 second hold with 5 second rest at 5#, 15 mins   10/23/23:   Patient in right sdly position with pillow between knees for comfort:  STW/M x 23 minutes to patient's left  lumbar musculature and piriformis f/b Int traction at 65# with 99 sec hold and 5 sec rest x 15 minutes.      10/16/23:   Patient in right sdly position with pillow between knees for comfort:  STW/M x 23 minutes to patient's left  lumbar musculature and piriformis f/b Int traction at 65# with 99 sec hold and 5 sec rest x 15 minutes.    PATIENT EDUCATION:  Education details: healing, HEP, traction Person educated: Patient Education method: Explanation Education comprehension: verbalized understanding  HOME EXERCISE PROGRAM:   ASSESSMENT:  CLINICAL IMPRESSION: Patient doing very well overall and has currently completed 12 of 12 visits.  Current goals met.  Plan to put patient on hold at this time.    OBJECTIVE IMPAIRMENTS: decreased ROM, decreased strength, hypomobility, impaired sensation, impaired tone, and pain.   ACTIVITY LIMITATIONS: bending and locomotion level  PARTICIPATION LIMITATIONS: shopping, community activity, and occupation  PERSONAL FACTORS: Past/current experiences, Profession, Time since onset of  injury/illness/exacerbation, and 1 comorbidity: Osteoarthritis  are also affecting patient's functional outcome.   REHAB POTENTIAL: Good  CLINICAL DECISION MAKING: Stable/uncomplicated  EVALUATION COMPLEXITY: Low   GOALS: Goals reviewed with patient? No  LONG TERM GOALS: Target date: 10/27/23  Patient will be independent with her HEP.  Baseline: has not tried the inversion table yet Goal status: MET 2.  Patient will be able to complete her daily activities without her familiar symptoms exceeding 4/10. Baseline:  Goal status: MET  3.  Patient will report being able to hike without being limited by her familiar symptoms.  Baseline:  Goal status: MET  4.  Patient will improve her left quadriceps strength to at least 4+/5 for improved function hiking.  Baseline: 4+/5 Goal status: MET  5.  Patient will be able to squat and lift her dogs (about 40 pounds) without reproducing her familiar symptoms.  Baseline:  Goal status: MET PLAN:  PT FREQUENCY: 1-2x/week  PT DURATION: 4 weeks  PLANNED INTERVENTIONS: 97164- PT Re-evaluation, 97750- Physical Performance Testing, 97110-Therapeutic exercises, 97530- Therapeutic activity, [id number]- Neuromuscular re-education, 97535- Self Care, 02859- Manual therapy, G0283- Electrical stimulation (unattended), [id number]- Ultrasound, 02987- Traction (mechanical), 79439 (1-2 muscles), 20561 (3+ muscles)- Dry Needling, Patient/Family education, Joint  mobilization, Spinal mobilization, Cryotherapy, and Moist heat.  PLAN FOR NEXT SESSION: recumbent bike, manual therapy, assess for dry needling, and lower extremity strengthening      Doyel Mulkern, [place], PT 11/21/2023, 4:49 PM

## 2023-11-22 DIAGNOSIS — Z79890 Hormone replacement therapy (postmenopausal): Secondary | ICD-10-CM | POA: Diagnosis not present

## 2023-11-22 DIAGNOSIS — Z6822 Body mass index (BMI) 22.0-22.9, adult: Secondary | ICD-10-CM | POA: Diagnosis not present

## 2023-11-22 DIAGNOSIS — E039 Hypothyroidism, unspecified: Secondary | ICD-10-CM | POA: Diagnosis not present

## 2023-11-22 DIAGNOSIS — M2550 Pain in unspecified joint: Secondary | ICD-10-CM | POA: Diagnosis not present

## 2023-11-22 DIAGNOSIS — N951 Menopausal and female climacteric states: Secondary | ICD-10-CM | POA: Diagnosis not present

## 2023-11-24 ENCOUNTER — Ambulatory Visit: Admitting: Family Medicine

## 2023-11-24 DIAGNOSIS — M25561 Pain in right knee: Secondary | ICD-10-CM | POA: Diagnosis not present

## 2023-11-24 DIAGNOSIS — M1711 Unilateral primary osteoarthritis, right knee: Secondary | ICD-10-CM | POA: Diagnosis not present

## 2023-12-04 DIAGNOSIS — R1013 Epigastric pain: Secondary | ICD-10-CM | POA: Diagnosis not present

## 2023-12-04 DIAGNOSIS — Z1211 Encounter for screening for malignant neoplasm of colon: Secondary | ICD-10-CM | POA: Diagnosis not present

## 2023-12-08 DIAGNOSIS — M25561 Pain in right knee: Secondary | ICD-10-CM | POA: Diagnosis not present

## 2023-12-08 DIAGNOSIS — M1711 Unilateral primary osteoarthritis, right knee: Secondary | ICD-10-CM | POA: Diagnosis not present

## 2023-12-25 ENCOUNTER — Ambulatory Visit: Admitting: Family Medicine

## 2024-01-12 DIAGNOSIS — Z1211 Encounter for screening for malignant neoplasm of colon: Secondary | ICD-10-CM | POA: Diagnosis not present

## 2024-01-12 DIAGNOSIS — K648 Other hemorrhoids: Secondary | ICD-10-CM | POA: Diagnosis not present

## 2024-01-12 DIAGNOSIS — K259 Gastric ulcer, unspecified as acute or chronic, without hemorrhage or perforation: Secondary | ICD-10-CM | POA: Diagnosis not present

## 2024-01-12 DIAGNOSIS — R1013 Epigastric pain: Secondary | ICD-10-CM | POA: Diagnosis not present

## 2024-01-12 DIAGNOSIS — K3189 Other diseases of stomach and duodenum: Secondary | ICD-10-CM | POA: Diagnosis not present

## 2024-02-04 NOTE — Progress Notes (Unsigned)
 Cardiology Office Note   Date:  02/07/2024   ID:  Carol Hoover, Carol Hoover 1950/04/27, MRN [id number]  PCP:  Nena Rosina LITTIE, PA-C  Cardiologist:   Lynwood Schilling, MD Referring:  Nena Rosina LITTIE, PA-C  Chief Complaint  Patient presents with   Chest Pain      History of Present Illness: Carol Hoover is a 73 y.o. female who presents for evaluation of chest pain and dizziness.  She has no prior past cardiac history.  I reviewed her primary care records and there is a mention of some dizziness.  However, she thinks that is been some intermittent ear or other issues.  She does not have any presyncope or syncope.  She does not have any orthostatic symptoms.  She has had some fleeting sharp chest discomfort.  She has had rare palpitations in the past.  An old EKG demonstrated some premature ectopic complexes but she does not feel this frequently.  She has had some labile blood pressures with diastolics sometimes being in the 50s.  She is very physically active.  She is on hikes and walks very briskly.  With that she feels well. The patient denies any new symptoms such as chest discomfort, neck or arm discomfort. There has been no new shortness of breath, PND or orthopnea. There have been no reported palpitations, presyncope or syncope.    Past Medical History:  Diagnosis Date   Arthritis    Trigger finger     Past Surgical History:  Procedure Laterality Date   TUBAL LIGATION       Current Outpatient Medications  Medication Sig Dispense Refill   b complex vitamins capsule Take 1 capsule by mouth daily.     DHEA 10 MG CAPS as directed Orally     fish oil-omega-3 fatty acids 1000 MG capsule Take 1 g by mouth daily.     Magnesium Bisglycinate 100 MG TABS Take 100 mg by mouth at bedtime.     pantoprazole  (PROTONIX ) 40 MG tablet Take 40 mg by mouth daily.     Testosterone  100 MG PLLT See admin instructions.     thyroid  (ARMOUR THYROID ) 30 MG tablet Take 30 mg by mouth daily before  breakfast.     traMADol  (ULTRAM ) 50 MG tablet Take 1 tablet (50 mg total) by mouth every 8 (eight) hours as needed. 60 tablet 1   Zinc Acetate 25 MG CAPS Take 25 mg by mouth daily.     No current facility-administered medications for this visit.    Allergies:   Codeine    Social History:  The patient  reports that she quit smoking about 55 years ago. Her smoking use included cigarettes. She has never used smokeless tobacco. She reports current alcohol use. She reports that she does not use drugs.   Family History:  The patient's family history includes Cancer in her brother; Dementia in her mother; Diabetes in her father; Heart attack in her sister; Heart attack (age of onset: 24) in her father; Hyperlipidemia in her father; Hypertension in her father.    ROS:  Please see the history of present illness.   Otherwise, review of systems are positive for none.   All other systems are reviewed and negative.      PHYSICAL EXAM: VS:  BP 132/80   Pulse 67   Ht 5' 5 (1.651 m)   Wt 136 lb (61.7 kg)   BMI 22.63 kg/m  , BMI Body mass index is 22.63 kg/m. GENERAL:  Well appearing HEENT:  Pupils equal round and reactive, fundi not visualized, oral mucosa unremarkable NECK:  No jugular venous distention, waveform within normal limits, carotid upstroke brisk and symmetric, no bruits, no thyromegaly LYMPHATICS:  No cervical, inguinal adenopathy LUNGS:  Clear to auscultation bilaterally BACK:  No CVA tenderness CHEST:  Unremarkable HEART:  PMI not displaced or sustained,S1 and S2 within normal limits, no S3, no S4, no clicks, no rubs, no murmurs ABD:  Flat, positive bowel sounds normal in frequency in pitch, no bruits, no rebound, no guarding, no midline pulsatile mass, no hepatomegaly, no splenomegaly EXT:  2 plus pulses throughout, no edema, no cyanosis no clubbing SKIN:  No rashes no nodules NEURO:  Cranial nerves II through XII grossly intact, motor grossly intact throughout [facility]:   Cognitively intact, oriented to person place and time    EKG:  EKG Interpretation Date/Time:  Wednesday February 07 2024 14:57:58 EST Ventricular Rate:  67 PR Interval:  164 QRS Duration:  96 QT Interval:  374 QTC Calculation: 395 R Axis:   51  Text Interpretation: Normal sinus rhythm When compared with ECG of 22-Jul-1998 09:20, Premature ventricular complexes are no longer Present Confirmed by Lavona Agent ([id number]) on 02/07/2024 3:32:26 PM     Recent Labs: 05/24/2023: ALT 30; BUN 13; Creatinine, Ser 0.59; Hemoglobin 15.7; Platelets 282.0; Potassium 4.1; Sodium 140; TSH 1.58    Lipid Panel    Component Value Date/Time   CHOL 215 (H) 03/29/2016 0925   TRIG 83 03/29/2016 0925   HDL 78 03/29/2016 0925   CHOLHDL 2.8 03/29/2016 0925   LDLCALC 120 (H) 03/29/2016 0925      Wt Readings from Last 3 Encounters:  02/07/24 136 lb (61.7 kg)  09/18/23 134 lb (60.8 kg)  07/07/23 130 lb (59 kg)      Other studies Reviewed: Additional studies/ records that were reviewed today include: Labs. Review of the above records demonstrates:  Please see elsewhere in the note.     ASSESSMENT AND PLAN:   CHEST DISCOMFORT: She has nonanginal chest discomfort.  Because of her family history I would like to screen her with a coronary calcium score and we talked about this.  This will help us  determine goals of therapy.  She would not need however further cardiovascular testing as she has a high calcium burden.  DIZZINESS: She has some rare palpitations but I do not suspect any cardiac etiology.  No further workup.  RISK REDUCTION: We had a long conversation about risk reduction.  She is already doing the therapeutic lifestyle changes and I would suggest.  I would like to see her most recent cholesterol profile and goals of therapy would be determined by the calcium score.  She would prefer nonprescription therapies for dyslipidemia but would consider other therapies pending a score.  Current  medicines are reviewed at length with the patient today.  The patient does not have concerns regarding medicines.  The following changes have been made:  no change  Labs/ tests ordered today include:   Orders Placed This Encounter  Procedures   CT CARDIAC SCORING (SELF PAY ONLY)   EKG 12-Lead     Disposition:   FU with me in as needed based on the results of the above.     Signed, Agent Lavona, MD  02/07/2024 4:29 PM    [facility]

## 2024-02-07 ENCOUNTER — Ambulatory Visit: Admitting: Cardiology

## 2024-02-07 ENCOUNTER — Encounter: Payer: Self-pay | Admitting: *Deleted

## 2024-02-07 ENCOUNTER — Encounter: Payer: Self-pay | Admitting: Cardiology

## 2024-02-07 VITALS — BP 132/80 | HR 67 | Ht 65.0 in | Wt 136.0 lb

## 2024-02-07 DIAGNOSIS — R072 Precordial pain: Secondary | ICD-10-CM | POA: Diagnosis not present

## 2024-02-07 DIAGNOSIS — R42 Dizziness and giddiness: Secondary | ICD-10-CM

## 2024-02-07 DIAGNOSIS — Z136 Encounter for screening for cardiovascular disorders: Secondary | ICD-10-CM | POA: Diagnosis not present

## 2024-02-07 NOTE — Patient Instructions (Addendum)
 Medication Instructions:  Your physician recommends that you continue on your current medications as directed. Please refer to the Current Medication list given to you today.  Labwork: none  Testing/Procedures: Coronary Calcium Score CT  Follow-Up: Your physician recommends that you schedule a follow-up appointment in: as needed.  Any Other Special Instructions Will Be Listed Below (If Applicable).  If you need a refill on your cardiac medications before your next appointment, please call your pharmacy.

## 2024-02-09 DIAGNOSIS — S92335A Nondisplaced fracture of third metatarsal bone, left foot, initial encounter for closed fracture: Secondary | ICD-10-CM | POA: Diagnosis not present

## 2024-02-21 DIAGNOSIS — R232 Flushing: Secondary | ICD-10-CM | POA: Diagnosis not present

## 2024-02-21 DIAGNOSIS — N951 Menopausal and female climacteric states: Secondary | ICD-10-CM | POA: Diagnosis not present

## 2024-02-21 DIAGNOSIS — E039 Hypothyroidism, unspecified: Secondary | ICD-10-CM | POA: Diagnosis not present

## 2024-02-21 DIAGNOSIS — Z6822 Body mass index (BMI) 22.0-22.9, adult: Secondary | ICD-10-CM | POA: Diagnosis not present

## 2024-02-21 DIAGNOSIS — N898 Other specified noninflammatory disorders of vagina: Secondary | ICD-10-CM | POA: Diagnosis not present

## 2024-03-01 ENCOUNTER — Ambulatory Visit (HOSPITAL_COMMUNITY)
Admission: RE | Admit: 2024-03-01 | Discharge: 2024-03-01 | Disposition: A | Discharge status: Home / Self Care | Payer: Self-pay | Source: Ambulatory Visit | Attending: Cardiology | Admitting: Cardiology

## 2024-03-01 DIAGNOSIS — Z136 Encounter for screening for cardiovascular disorders: Secondary | ICD-10-CM

## 2024-03-17 ENCOUNTER — Ambulatory Visit: Payer: Self-pay | Admitting: Cardiology

## 2024-03-19 NOTE — Telephone Encounter (Signed)
 PT returning call to nurse
